# Patient Record
Sex: Female | Born: 1945 | Race: White | Hispanic: No | Marital: Married | Smoking: Never smoker
Health system: Southern US, Community
[De-identification: ages and names within clinical notes are randomized; demographics above are authoritative.]

## PROBLEM LIST (undated history)

## (undated) DIAGNOSIS — T7840XA Allergy, unspecified, initial encounter: Secondary | ICD-10-CM

## (undated) DIAGNOSIS — N390 Urinary tract infection, site not specified: Secondary | ICD-10-CM

## (undated) DIAGNOSIS — K219 Gastro-esophageal reflux disease without esophagitis: Secondary | ICD-10-CM

## (undated) DIAGNOSIS — F32A Depression, unspecified: Secondary | ICD-10-CM

## (undated) DIAGNOSIS — F329 Major depressive disorder, single episode, unspecified: Secondary | ICD-10-CM

## (undated) DIAGNOSIS — K5190 Ulcerative colitis, unspecified, without complications: Secondary | ICD-10-CM

## (undated) DIAGNOSIS — J45909 Unspecified asthma, uncomplicated: Secondary | ICD-10-CM

## (undated) HISTORY — DX: Major depressive disorder, single episode, unspecified: F32.9

## (undated) HISTORY — DX: Urinary tract infection, site not specified: N39.0

## (undated) HISTORY — DX: Unspecified asthma, uncomplicated: J45.909

## (undated) HISTORY — DX: Ulcerative colitis, unspecified, without complications: K51.90

## (undated) HISTORY — DX: Allergy, unspecified, initial encounter: T78.40XA

## (undated) HISTORY — DX: Depression, unspecified: F32.A

## (undated) HISTORY — PX: TONSILLECTOMY: SHX5217

## (undated) HISTORY — DX: Gastro-esophageal reflux disease without esophagitis: K21.9

## (undated) HISTORY — PX: COLONOSCOPY: SHX174

---
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
## 2013-12-30 LAB — HM PAP SMEAR

## 2014-04-01 DIAGNOSIS — J45909 Unspecified asthma, uncomplicated: Secondary | ICD-10-CM | POA: Diagnosis not present

## 2014-04-01 DIAGNOSIS — K5190 Ulcerative colitis, unspecified, without complications: Secondary | ICD-10-CM | POA: Diagnosis not present

## 2014-04-01 DIAGNOSIS — R197 Diarrhea, unspecified: Secondary | ICD-10-CM | POA: Diagnosis not present

## 2014-05-07 DIAGNOSIS — F3289 Other specified depressive episodes: Secondary | ICD-10-CM | POA: Diagnosis not present

## 2014-05-07 DIAGNOSIS — F329 Major depressive disorder, single episode, unspecified: Secondary | ICD-10-CM | POA: Diagnosis not present

## 2014-05-07 DIAGNOSIS — Z23 Encounter for immunization: Secondary | ICD-10-CM | POA: Diagnosis not present

## 2014-05-27 DIAGNOSIS — L819 Disorder of pigmentation, unspecified: Secondary | ICD-10-CM | POA: Diagnosis not present

## 2014-05-27 DIAGNOSIS — R238 Other skin changes: Secondary | ICD-10-CM | POA: Diagnosis not present

## 2014-05-27 DIAGNOSIS — L708 Other acne: Secondary | ICD-10-CM | POA: Diagnosis not present

## 2014-05-27 DIAGNOSIS — D235 Other benign neoplasm of skin of trunk: Secondary | ICD-10-CM | POA: Diagnosis not present

## 2014-05-29 LAB — HM COLONOSCOPY

## 2014-10-08 DIAGNOSIS — Z23 Encounter for immunization: Secondary | ICD-10-CM | POA: Diagnosis not present

## 2014-12-16 DIAGNOSIS — L821 Other seborrheic keratosis: Secondary | ICD-10-CM | POA: Diagnosis not present

## 2014-12-16 DIAGNOSIS — L720 Epidermal cyst: Secondary | ICD-10-CM | POA: Diagnosis not present

## 2014-12-16 DIAGNOSIS — D229 Melanocytic nevi, unspecified: Secondary | ICD-10-CM | POA: Diagnosis not present

## 2014-12-16 DIAGNOSIS — B351 Tinea unguium: Secondary | ICD-10-CM | POA: Diagnosis not present

## 2014-12-16 DIAGNOSIS — L700 Acne vulgaris: Secondary | ICD-10-CM | POA: Diagnosis not present

## 2014-12-30 DIAGNOSIS — Z01419 Encounter for gynecological examination (general) (routine) without abnormal findings: Secondary | ICD-10-CM | POA: Diagnosis not present

## 2014-12-30 DIAGNOSIS — Z1231 Encounter for screening mammogram for malignant neoplasm of breast: Secondary | ICD-10-CM | POA: Diagnosis not present

## 2014-12-30 LAB — HM MAMMOGRAPHY: HM Mammogram: NORMAL

## 2014-12-31 ENCOUNTER — Ambulatory Visit (INDEPENDENT_AMBULATORY_CARE_PROVIDER_SITE_OTHER): Payer: Medicare Other | Admitting: Internal Medicine

## 2014-12-31 ENCOUNTER — Encounter: Payer: Self-pay | Admitting: Internal Medicine

## 2014-12-31 VITALS — BP 140/90 | HR 91 | Temp 98.0°F | Resp 20 | Ht 65.0 in | Wt 172.0 lb

## 2014-12-31 DIAGNOSIS — K219 Gastro-esophageal reflux disease without esophagitis: Secondary | ICD-10-CM | POA: Diagnosis not present

## 2014-12-31 DIAGNOSIS — Z0000 Encounter for general adult medical examination without abnormal findings: Secondary | ICD-10-CM | POA: Diagnosis not present

## 2014-12-31 DIAGNOSIS — K5190 Ulcerative colitis, unspecified, without complications: Secondary | ICD-10-CM

## 2014-12-31 DIAGNOSIS — M810 Age-related osteoporosis without current pathological fracture: Secondary | ICD-10-CM | POA: Diagnosis not present

## 2014-12-31 MED ORDER — VENLAFAXINE HCL ER 37.5 MG PO CP24: 37.5000 mg | ORAL_CAPSULE | ORAL | Status: DC

## 2014-12-31 NOTE — Patient Instructions (Signed)
It is important that you exercise regularly, at least 20 minutes 3 to 4 times per week.  If you develop chest pain or shortness of breath seek  medical attention.  Please check your blood pressure on a regular basis.  If it is consistently greater than 150/90, please make an office appointment.  Fat and Cholesterol Control Diet Your diet has an affect on your fat and cholesterol levels in your blood and organs. Too much fat and cholesterol in your blood can affect your:  Heart.  Blood vessels (arteries, veins).  Gallbladder.  Liver.  Pancreas. CONTROL FAT AND CHOLESTEROL WITH DIET Certain foods raise cholesterol and others lower it. It is important to replace bad fats with other types of fat.  Do not eat:  Fatty meats, such as hot dogs and salami.  Stick margarine and some tub margarines that have "partially hydrogenated oils" in them.  Baked goods, such as cookies and crackers that have "partially hydrogenated oils" in them.  Saturated tropical oils, such as coconut and palm oil. Eat the following foods:  Round or loin cuts of red meat.  Chicken (without skin).  Fish.  Veal.  [place] breast.  Shellfish.  Fruit, such as apples.  Vegetables, such as broccoli, potatoes, and carrots.  Beans, peas, and lentils (legumes).  Grains, such as barley, rice, couscous, and bulgar wheat.  Pasta (without cream sauces). Look for foods that are nonfat, low in fat, and low in cholesterol.  FIND FOODS THAT ARE LOWER IN FAT AND CHOLESTEROL  Find foods with soluble fiber and plant sterols (phytosterol). You should eat 2 grams a day of these foods. These foods include:  Fruits.  Vegetables.  Whole grains.  Dried beans and peas.  Nuts and seeds.  Read package labels. Look for low-saturated fats, trans fat free, low-fat foods.  Choose cheese that have only 2 to 3 grams of saturated fat per ounce.  Use heart-healthy tub margarine that is free of trans fat or  partially hydrogenated oil.  Avoid buying baked goods that have partially hydrogenated oils in them. Instead, buy baked goods made with whole grains (whole-wheat or whole oat flour). Avoid baked goods labeled with "flour" or "enriched flour."  Buy non-creamy canned soups with reduced salt and no added fats. PREPARING YOUR FOOD  Broil, bake, steam, or roast foods. Do not fry food.  Use non-stick cooking sprays.  Use lemon or herbs to flavor food instead of using butter or stick margarine.  Use nonfat yogurt, salsa, or low-fat dressings for salads. LOW-SATURATED FAT / LOW-FAT FOOD SUBSTITUTES  Meats / Saturated Fat (g)  Avoid: Steak, marbled (3 oz/85 g) / 11 g.  Choose: Steak, lean (3 oz/85 g) / 4 g.  Avoid: Hamburger (3 oz/85 g) / 7 g.  Choose: Hamburger, lean (3 oz/85 g) / 5 g.  Avoid: Ham (3 oz/85 g) / 6 g.  Choose: Ham, lean cut (3 oz/85 g) / 2.4 g.  Avoid: Chicken, with skin, dark meat (3 oz/85 g) / 4 g.  Choose: Chicken, skin removed, dark meat (3 oz/85 g) / 2 g.  Avoid: Chicken, with skin, light meat (3 oz/85 g) / 2.5 g.  Choose: Chicken, skin removed, light meat (3 oz/85 g) / 1 g. Dairy / Saturated Fat (g)  Avoid: Whole milk (1 cup) / 5 g.  Choose: Low-fat milk, 2% (1 cup) / 3 g.  Choose: Low-fat milk, 1% (1 cup) / 1.5 g.  Choose: Skim milk (1 cup) / 0.3 g.  Avoid: Hard cheese (1 oz/28 g) / 6 g.  Choose: Skim milk cheese (1 oz/28 g) / 2 to 3 g.  Avoid: Cottage cheese, 4% fat (1 cup) / 6.5 g.  Choose: Low-fat cottage cheese, 1% fat (1 cup) / 1.5 g.  Avoid: Ice cream (1 cup) / 9 g.  Choose: Sherbet (1 cup) / 2.5 g.  Choose: Nonfat frozen yogurt (1 cup) / 0.3 g.  Choose: Frozen fruit bar / trace.  Avoid: Whipped cream (1 tbs) / 3.5 g.  Choose: Nondairy whipped topping (1 tbs) / 1 g. Condiments / Saturated Fat (g)  Avoid: Mayonnaise (1 tbs) / 2 g.  Choose: Low-fat mayonnaise (1 tbs) / 1 g.  Avoid: Butter (1 tbs) / 7 g.  Choose: Extra light  margarine (1 tbs) / 1 g.  Avoid: Coconut oil (1 tbs) / 11.8 g.  Choose: Olive oil (1 tbs) / 1.8 g.  Choose: Corn oil (1 tbs) / 1.7 g.  Choose: Safflower oil (1 tbs) / 1.2 g.  Choose: Sunflower oil (1 tbs) / 1.4 g.  Choose: Soybean oil (1 tbs) / 2.4 g .  Choose: Canola oil (1 tbs) / 1 g. Document Released: 06/04/2012 Document Revised: 08/06/2013 Document Reviewed: 03/05/2014 [facility] Patient Information 2015 [place], [place]. This information is not intended to replace advice given to you by your health care provider. Make sure you discuss any questions you have with your health care provider. Health Maintenance Adopting a healthy lifestyle and getting preventive care can go a long way to promote health and wellness. Talk with your health care provider about what schedule of regular examinations is right for you. This is a good chance for you to check in with your provider about disease prevention and staying healthy. In between checkups, there are plenty of things you can do on your own. Experts have done a lot of research about which lifestyle changes and preventive measures are most likely to keep you healthy. Ask your health care provider for more information. WEIGHT AND DIET  Eat a healthy diet  Be sure to include plenty of vegetables, fruits, low-fat dairy products, and lean protein.  Do not eat a lot of foods high in solid fats, added sugars, or salt.  Get regular exercise. This is one of the most important things you can do for your health.  Most adults should exercise for at least 150 minutes each week. The exercise should increase your heart rate and make you sweat (moderate-intensity exercise).  Most adults should also do strengthening exercises at least twice a week. This is in addition to the moderate-intensity exercise.  Maintain a healthy weight  Body mass index (BMI) is a measurement that can be used to identify possible weight problems. It estimates body fat based on  height and weight. Your health care provider can help determine your BMI and help you achieve or maintain a healthy weight.  For females 23 years of age and older:   A BMI below 18.5 is considered underweight.  A BMI of 18.5 to 24.9 is normal.  A BMI of 25 to 29.9 is considered overweight.  A BMI of 30 and above is considered obese.  Watch levels of cholesterol and blood lipids  You should start having your blood tested for lipids and cholesterol at 67 years of age, then have this test every 5 years.  You may need to have your cholesterol levels checked more often if:  Your lipid or cholesterol levels are high.  You  are older than 67 years of age.  You are at high risk for heart disease.  CANCER SCREENING   Lung Cancer  Lung cancer screening is recommended for adults 30-70 years old who are at high risk for lung cancer because of a history of smoking.  A yearly low-dose CT scan of the lungs is recommended for people who:  Currently smoke.  Have quit within the past 15 years.  Have at least a 30-pack-year history of smoking. A pack year is smoking an average of one pack of cigarettes a day for 1 year.  Yearly screening should continue until it has been 15 years since you quit.  Yearly screening should stop if you develop a health problem that would prevent you from having lung cancer treatment.  Breast Cancer  Practice breast self-awareness. This means understanding how your breasts normally appear and feel.  It also means doing regular breast self-exams. Let your health care provider know about any changes, no matter how small.  If you are in your 20s or 30s, you should have a clinical breast exam (CBE) by a health care provider every 1-3 years as part of a regular health exam.  If you are 27 or older, have a CBE every year. Also consider having a breast X-ray (mammogram) every year.  If you have a family history of breast cancer, talk to your health care  provider about genetic screening.  If you are at high risk for breast cancer, talk to your health care provider about having an MRI and a mammogram every year.  Breast cancer gene (BRCA) assessment is recommended for women who have family members with BRCA-related cancers. BRCA-related cancers include:  Breast.  Ovarian.  Tubal.  Peritoneal cancers.  Results of the assessment will determine the need for genetic counseling and BRCA1 and BRCA2 testing. Cervical Cancer Routine pelvic examinations to screen for cervical cancer are no longer recommended for nonpregnant women who are considered low risk for cancer of the pelvic organs (ovaries, uterus, and vagina) and who do not have symptoms. A pelvic examination may be necessary if you have symptoms including those associated with pelvic infections. Ask your health care provider if a screening pelvic exam is right for you.   The Pap test is the screening test for cervical cancer for women who are considered at risk.  If you had a hysterectomy for a problem that was not cancer or a condition that could lead to cancer, then you no longer need Pap tests.  If you are older than 65 years, and you have had normal Pap tests for the past 10 years, you no longer need to have Pap tests.  If you have had past treatment for cervical cancer or a condition that could lead to cancer, you need Pap tests and screening for cancer for at least 20 years after your treatment.  If you no longer get a Pap test, assess your risk factors if they change (such as having a new sexual partner). This can affect whether you should start being screened again.  Some women have medical problems that increase their chance of getting cervical cancer. If this is the case for you, your health care provider may recommend more frequent screening and Pap tests.  The human papillomavirus (HPV) test is another test that may be used for cervical cancer screening. The HPV test looks  for the virus that can cause cell changes in the cervix. The cells collected during the Pap test can be  tested for HPV.  The HPV test can be used to screen women 37 years of age and older. Getting tested for HPV can extend the interval between normal Pap tests from three to five years.  An HPV test also should be used to screen women of any age who have unclear Pap test results.  After 67 years of age, women should have HPV testing as often as Pap tests.  Colorectal Cancer  This type of cancer can be detected and often prevented.  Routine colorectal cancer screening usually begins at 67 years of age and continues through 67 years of age.  Your health care provider may recommend screening at an earlier age if you have risk factors for colon cancer.  Your health care provider may also recommend using home test kits to check for hidden blood in the stool.  A small camera at the end of a tube can be used to examine your colon directly (sigmoidoscopy or colonoscopy). This is done to check for the earliest forms of colorectal cancer.  Routine screening usually begins at age 9.  Direct examination of the colon should be repeated every 5-10 years through 67 years of age. However, you may need to be screened more often if early forms of precancerous polyps or small growths are found. Skin Cancer  Check your skin from head to toe regularly.  Tell your health care provider about any new moles or changes in moles, especially if there is a change in a mole's shape or color.  Also tell your health care provider if you have a mole that is larger than the size of a pencil eraser.  Always use sunscreen. Apply sunscreen liberally and repeatedly throughout the day.  Protect yourself by wearing long sleeves, pants, a wide-brimmed hat, and sunglasses whenever you are outside. HEART DISEASE, DIABETES, AND HIGH BLOOD PRESSURE   Have your blood pressure checked at least every 1-2 years. High blood  pressure causes heart disease and increases the risk of stroke.  If you are between 2 years and 49 years old, ask your health care provider if you should take aspirin to prevent strokes.  Have regular diabetes screenings. This involves taking a blood sample to check your fasting blood sugar level.  If you are at a normal weight and have a low risk for diabetes, have this test once every three years after 67 years of age.  If you are overweight and have a high risk for diabetes, consider being tested at a younger age or more often. PREVENTING INFECTION  Hepatitis B  If you have a higher risk for hepatitis B, you should be screened for this virus. You are considered at high risk for hepatitis B if:  You were born in a country where hepatitis B is common. Ask your health care provider which countries are considered high risk.  Your parents were born in a high-risk country, and you have not been immunized against hepatitis B (hepatitis B vaccine).  You have HIV or AIDS.  You use needles to inject street drugs.  You live with someone who has hepatitis B.  You have had sex with someone who has hepatitis B.  You get hemodialysis treatment.  You take certain medicines for conditions, including cancer, organ transplantation, and autoimmune conditions. Hepatitis C  Blood testing is recommended for:  Everyone born from [birth year] through 1963.  Anyone with known risk factors for hepatitis C. Sexually transmitted infections (STIs)  You should be screened for sexually transmitted infections (  STIs) including gonorrhea and chlamydia if:  You are sexually active and are younger than 67 years of age.  You are older than 67 years of age and your health care provider tells you that you are at risk for this type of infection.  Your sexual activity has changed since you were last screened and you are at an increased risk for chlamydia or gonorrhea. Ask your health care provider if you are at  risk.  If you do not have HIV, but are at risk, it may be recommended that you take a prescription medicine daily to prevent HIV infection. This is called pre-exposure prophylaxis (PrEP). You are considered at risk if:  You are sexually active and do not regularly use condoms or know the HIV status of your partner(s).  You take drugs by injection.  You are sexually active with a partner who has HIV. Talk with your health care provider about whether you are at high risk of being infected with HIV. If you choose to begin PrEP, you should first be tested for HIV. You should then be tested every 3 months for as long as you are taking PrEP.  PREGNANCY   If you are premenopausal and you may become pregnant, ask your health care provider about preconception counseling.  If you may become pregnant, take 400 to 800 micrograms (mcg) of folic acid every day.  If you want to prevent pregnancy, talk to your health care provider about birth control (contraception). OSTEOPOROSIS AND MENOPAUSE   Osteoporosis is a disease in which the bones lose minerals and strength with aging. This can result in serious bone fractures. Your risk for osteoporosis can be identified using a bone density scan.  If you are 9 years of age or older, or if you are at risk for osteoporosis and fractures, ask your health care provider if you should be screened.  Ask your health care provider whether you should take a calcium or vitamin D supplement to lower your risk for osteoporosis.  Menopause may have certain physical symptoms and risks.  Hormone replacement therapy may reduce some of these symptoms and risks. Talk to your health care provider about whether hormone replacement therapy is right for you.  HOME CARE INSTRUCTIONS   Schedule regular health, dental, and eye exams.  Stay current with your immunizations.   Do not use any tobacco products including cigarettes, chewing tobacco, or electronic cigarettes.  If  you are pregnant, do not drink alcohol.  If you are breastfeeding, limit how much and how often you drink alcohol.  Limit alcohol intake to no more than 1 drink per day for nonpregnant women. One drink equals 12 ounces of beer, 5 ounces of wine, or 1 ounces of hard liquor.  Do not use street drugs.  Do not share needles.  Ask your health care provider for help if you need support or information about quitting drugs.  Tell your health care provider if you often feel depressed.  Tell your health care provider if you have ever been abused or do not feel safe at home. Document Released: 06/19/2011 Document Revised: 04/20/2014 Document Reviewed: 11/05/2013 [facility] Patient Information 2015 [place], [place]. This information is not intended to replace advice given to you by your health care provider. Make sure you discuss any questions you have with your health care provider.

## 2014-12-31 NOTE — Progress Notes (Signed)
Subjective:    Patient ID: Whitney Beasley, female    DOB: [birth year], 69 y.o.   MRN: [id number]  HPI  69 year old patient who is seen today to establish with our practice. She has a history of situational anxiety and has been on a every other day regimen of Effexor.  Stressors included illness with her mother who suffered an MI and also a suicide attempt from her son who has bipolar disorder.  At the present time.  She is doing quite well She had a recent gynecologic examination yesterday Past medical history is pertinent for a history of ulcerative colitis.  She was taken off medications in April 2015 at which time her last colonoscopy was normal.  No GI symptoms She has a history of childhood asthma and allergic rhinitis Additionally history of GERD which has been stable off medication History of osteoporosis, presently is on raloxifene by OB/GYN  Gravida 1, para one, abortus 0, 1 vaginal delivery 7  Family history mother age 65.  Recent MI.  Has a history of diabetes and osteoporosis Father died in his eighties, history of COPD and colonic polyps One sister has a history of chronic back pain  Social history.  Married.  One son with bipolar disorder Relocated back to [place] area after 15 years in [place], [place] Nonsmoker   1. Risk factors, based on past  M,S,F history.  No cardiovascular risk factors  2.  Physical activities: No activity restrictions.  Walks daily  3.  Depression/mood: History of situational anxiety, presently stable  4.  Hearing: No deficits  5.  ADL's: Independent in all aspects of daily living  6.  Fall risk: Low  7.  Home safety: No problems identified  8.  Height weight, and visual acuity; height and weight stable no change in visual acuity  9.  Counseling: Heart healthy diet regular exercise.  Modest weight loss.  All encouraged  10. Lab orders based on risk factors: Will review office notes from her previous primary care  physician  11. Referral : We'll need follow-up colonoscopy 2018  12. Care plan: Heart healthy diet  13. Cognitive assessment: Alert and oriented with normal affect no cognitive dysfunction  14. Screening: We'll continue annual health examination with primary care and GYN  15. Provider List Update: GYN GI and primary care    Review of Systems  Constitutional: Negative for fever, appetite change, fatigue and unexpected weight change.  HENT: Negative for congestion, dental problem, ear pain, hearing loss, mouth sores, nosebleeds, sinus pressure, sore throat, tinnitus, trouble swallowing and voice change.   Eyes: Negative for photophobia, pain, redness and visual disturbance.  Respiratory: Negative for cough, chest tightness and shortness of breath.   Cardiovascular: Negative for chest pain, palpitations and leg swelling.  Gastrointestinal: Negative for nausea, vomiting, abdominal pain, diarrhea, constipation, blood in stool, abdominal distention and rectal pain.  Genitourinary: Negative for dysuria, urgency, frequency, hematuria, flank pain, vaginal bleeding, vaginal discharge, difficulty urinating, genital sores, vaginal pain, menstrual problem and pelvic pain.  Musculoskeletal: Negative for back pain, arthralgias and neck stiffness.  Skin: Negative for rash.  Neurological: Negative for dizziness, syncope, speech difficulty, weakness, light-headedness, numbness and headaches.  Hematological: Negative for adenopathy. Does not bruise/bleed easily.  Psychiatric/Behavioral: Negative for suicidal ideas, behavioral problems, self-injury, dysphoric mood and agitation. The patient is not nervous/anxious.        Objective:   Physical Exam  Constitutional: She is oriented to person, place, and time. She appears well-developed and well-nourished.  HENT:  Head: Normocephalic and atraumatic.  Right Ear: External ear normal.  Left Ear: External ear normal.  Mouth/Throat: Oropharynx is clear and  moist.  Eyes: Conjunctivae and EOM are normal.  Neck: Normal range of motion. Neck supple. No JVD present. No thyromegaly present.  Cardiovascular: Normal rate, regular rhythm, normal heart sounds and intact distal pulses.   No murmur heard. Pulmonary/Chest: Effort normal and breath sounds normal. She has no wheezes. She has no rales.  Abdominal: Soft. Bowel sounds are normal. She exhibits no distension and no mass. There is no tenderness. There is no rebound and no guarding.  Musculoskeletal: Normal range of motion. She exhibits no edema or tenderness.  Neurological: She is alert and oriented to person, place, and time. She has normal reflexes. No cranial nerve deficit. She exhibits normal muscle tone. Coordination normal.  Skin: Skin is warm and dry. No rash noted.  Psychiatric: She has a normal mood and affect. Her behavior is normal.          Assessment & Plan:    Preventive health Osteoporosis.  Continue the Evista, calcium and vitamin D supplementation Situational stress.  Patient will consider further taper and discontinuation of Effexor GERD.  We'll continue antireflux regimen and when necessary Prilosec History of ulcerative colitis.  Follow-up colonoscopy 2018 recommended  Medical records will be obtained Heart healthy diet regular exercise and modest weight loss.  All encouraged

## 2014-12-31 NOTE — Progress Notes (Signed)
Pre visit review using our clinic review tool, if applicable. No additional management support is needed unless otherwise documented below in the visit note. 

## 2015-01-22 ENCOUNTER — Other Ambulatory Visit: Payer: Self-pay | Admitting: *Deleted

## 2015-03-07 DIAGNOSIS — J189 Pneumonia, unspecified organism: Secondary | ICD-10-CM | POA: Diagnosis not present

## 2015-03-07 DIAGNOSIS — R05 Cough: Secondary | ICD-10-CM | POA: Diagnosis not present

## 2015-03-07 DIAGNOSIS — J329 Chronic sinusitis, unspecified: Secondary | ICD-10-CM | POA: Diagnosis not present

## 2015-03-17 DIAGNOSIS — D229 Melanocytic nevi, unspecified: Secondary | ICD-10-CM | POA: Diagnosis not present

## 2015-03-17 DIAGNOSIS — B351 Tinea unguium: Secondary | ICD-10-CM | POA: Diagnosis not present

## 2015-03-17 DIAGNOSIS — D1801 Hemangioma of skin and subcutaneous tissue: Secondary | ICD-10-CM | POA: Diagnosis not present

## 2015-03-17 DIAGNOSIS — D225 Melanocytic nevi of trunk: Secondary | ICD-10-CM | POA: Diagnosis not present

## 2015-03-17 DIAGNOSIS — Z85828 Personal history of other malignant neoplasm of skin: Secondary | ICD-10-CM | POA: Diagnosis not present

## 2015-03-17 DIAGNOSIS — L821 Other seborrheic keratosis: Secondary | ICD-10-CM | POA: Diagnosis not present

## 2015-03-30 ENCOUNTER — Telehealth: Payer: Self-pay | Admitting: Internal Medicine

## 2015-03-30 ENCOUNTER — Telehealth: Payer: Self-pay | Admitting: *Deleted

## 2015-03-30 ENCOUNTER — Encounter: Payer: Self-pay | Admitting: *Deleted

## 2015-03-30 NOTE — Telephone Encounter (Signed)
Received medical records from [facility]. Sent to Dr. Burnice Logan. 03/30/15/ss

## 2015-03-30 NOTE — Telephone Encounter (Signed)
Updated immunizations and medication lists.

## 2015-06-09 ENCOUNTER — Ambulatory Visit (INDEPENDENT_AMBULATORY_CARE_PROVIDER_SITE_OTHER): Payer: Medicare Other | Admitting: Internal Medicine

## 2015-06-09 ENCOUNTER — Encounter: Payer: Self-pay | Admitting: Internal Medicine

## 2015-06-09 VITALS — BP 110/74 | HR 92 | Temp 98.3°F | Resp 20 | Ht 65.0 in | Wt 168.0 lb

## 2015-06-09 DIAGNOSIS — W57XXXA Bitten or stung by nonvenomous insect and other nonvenomous arthropods, initial encounter: Secondary | ICD-10-CM

## 2015-06-09 DIAGNOSIS — T148 Other injury of unspecified body region: Secondary | ICD-10-CM

## 2015-06-09 DIAGNOSIS — M791 Myalgia, unspecified site: Secondary | ICD-10-CM

## 2015-06-09 NOTE — Patient Instructions (Signed)
Report any new or worsening symptoms  Call or return to clinic prn if these symptoms worsen or fail to improve as anticipated.

## 2015-06-09 NOTE — Progress Notes (Signed)
Subjective:    Patient ID: Whitney Beasley, female    DOB: January 17, 1946, 69 y.o.   MRN: [id number]  HPI  69 year old patient who is seen today concerned about tick bite exposure.  2 weeks ago she removed tics  from both her left and right chest wall areas.  No tics were engorged. For the past 3 days she has felt unwell with some fever, chills and myalgias.  No documented fever.  She still has some malaise but has felt slightly better over the past 24 hours.  Past Medical History  Diagnosis Date  . Asthma   . Depression   . Allergy   . GERD (gastroesophageal reflux disease)   . UTI (urinary tract infection)   . Ulcerative colitis     History   Social History  . Marital Status: Married    Spouse Name: N/A  . Number of Children: N/A  . Years of Education: N/A   Occupational History  . Not on file.   Social History Main Topics  . Smoking status: Never Smoker   . Smokeless tobacco: Never Used  . Alcohol Use: 8.4 oz/week    14 Glasses of wine per week  . Drug Use: No  . Sexual Activity:    Partners: Male   Other Topics Concern  . Not on file   Social History Narrative    Past Surgical History  Procedure Laterality Date  . Tonsillectomy Bilateral 1950's    No family history on file.  Allergies  Allergen Reactions  . Minocycline Other (See Comments)    Joint pain and Fatigue    Current Outpatient Prescriptions on File Prior to Visit  Medication Sig Dispense Refill  . B Complex-C (SUPER B COMPLEX PO) Take 1 tablet by mouth daily.    . calcium carbonate 200 MG capsule Take 1,000 mg by mouth daily.    . cephALEXin (KEFLEX) 500 MG capsule Take 500 mg by mouth as needed (acne). Prescribed by Dermatologist    . Glucosamine-Chondroitin 750-600 MG TABS Take 1 tablet by mouth daily.    Marland Kitchen loratadine (CLARITIN) 10 MG tablet Take 10 mg by mouth daily.    . Multiple Vitamin (MULTIVITAMIN) tablet Take 1 tablet by mouth daily.    . Omega-3 Fatty Acids (OMEGA 3 PO) Take 1  capsule by mouth daily.    Marland Kitchen omeprazole (PRILOSEC) 20 MG capsule Take 20 mg by mouth daily.    . raloxifene (EVISTA) 60 MG tablet Take 60 mg by mouth daily. Prescribed by Dr. Marina Gravel, GYN     No current facility-administered medications on file prior to visit.    BP 110/74 mmHg  Pulse 92  Temp(Src) 98.3 F (36.8 C) (Oral)  Resp 20  Ht 5' 5"  (1.651 m)  Wt 168 lb (76.204 kg)  BMI 27.96 kg/m2  SpO2 98%  LMP  (LMP Unknown)     Review of Systems  Constitutional: Positive for chills, activity change, appetite change and fatigue. Negative for fever and diaphoresis.  HENT: Negative for congestion, dental problem, hearing loss, rhinorrhea, sinus pressure, sore throat and tinnitus.   Eyes: Negative for pain, discharge and visual disturbance.  Respiratory: Negative for cough and shortness of breath.   Cardiovascular: Negative for chest pain, palpitations and leg swelling.  Gastrointestinal: Negative for nausea, vomiting, abdominal pain, diarrhea, constipation, blood in stool and abdominal distention.  Genitourinary: Negative for dysuria, urgency, frequency, hematuria, flank pain, vaginal bleeding, vaginal discharge, difficulty urinating, vaginal pain and pelvic pain.  Musculoskeletal: Negative  for joint swelling, arthralgias and gait problem.  Skin: Negative for rash.  Neurological: Negative for dizziness, syncope, speech difficulty, weakness, numbness and headaches.  Hematological: Negative for adenopathy.  Psychiatric/Behavioral: Negative for behavioral problems, dysphoric mood and agitation. The patient is not nervous/anxious.        Objective:   Physical Exam  Constitutional: She is oriented to person, place, and time. She appears well-developed and well-nourished.  HENT:  Head: Normocephalic.  Right Ear: External ear normal.  Left Ear: External ear normal.  Mouth/Throat: Oropharynx is clear and moist.  Eyes: Conjunctivae and EOM are normal. Pupils are equal, round, and  reactive to light.  Neck: Normal range of motion. Neck supple. No thyromegaly present.  Cardiovascular: Normal rate, regular rhythm, normal heart sounds and intact distal pulses.   Pulmonary/Chest: Effort normal and breath sounds normal.  Abdominal: Soft. Bowel sounds are normal. She exhibits no mass. There is no tenderness.  Musculoskeletal: Normal range of motion.  Lymphadenopathy:    She has no cervical adenopathy.  Neurological: She is alert and oriented to person, place, and time.  Skin: Skin is warm and dry. No rash noted.  2.  Small scaly papules 3-4 mm in diameter involving the left and right chest wall areas.  No surrounding erythema  Psychiatric: She has a normal mood and affect. Her behavior is normal.          Assessment & Plan:   Tic bite exposure/ viral symdrome.   Initial plan was to treat prophylactically with doxycycline for 1 week.  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  No other good choices for antibiotic prophylaxis.  Since tick born illness.  Likelihood is low.  We'll clinically observe at this time.  She will report any new or worsening symptoms

## 2015-06-09 NOTE — Progress Notes (Signed)
Pre visit review using our clinic review tool, if applicable. No additional management support is needed unless otherwise documented below in the visit note. 

## 2015-10-04 ENCOUNTER — Ambulatory Visit: Payer: Medicare Other | Admitting: *Deleted

## 2015-10-28 DIAGNOSIS — Z23 Encounter for immunization: Secondary | ICD-10-CM | POA: Diagnosis not present

## 2015-12-02 DIAGNOSIS — L812 Freckles: Secondary | ICD-10-CM | POA: Diagnosis not present

## 2015-12-02 DIAGNOSIS — L700 Acne vulgaris: Secondary | ICD-10-CM | POA: Diagnosis not present

## 2015-12-02 DIAGNOSIS — D225 Melanocytic nevi of trunk: Secondary | ICD-10-CM | POA: Diagnosis not present

## 2015-12-02 DIAGNOSIS — Z85828 Personal history of other malignant neoplasm of skin: Secondary | ICD-10-CM | POA: Diagnosis not present

## 2015-12-02 DIAGNOSIS — L821 Other seborrheic keratosis: Secondary | ICD-10-CM | POA: Diagnosis not present

## 2016-01-13 DIAGNOSIS — Z124 Encounter for screening for malignant neoplasm of cervix: Secondary | ICD-10-CM | POA: Diagnosis not present

## 2016-01-13 DIAGNOSIS — Z6828 Body mass index (BMI) 28.0-28.9, adult: Secondary | ICD-10-CM | POA: Diagnosis not present

## 2016-01-13 DIAGNOSIS — Z1231 Encounter for screening mammogram for malignant neoplasm of breast: Secondary | ICD-10-CM | POA: Diagnosis not present

## 2016-01-18 ENCOUNTER — Other Ambulatory Visit: Payer: Self-pay | Admitting: Obstetrics and Gynecology

## 2016-01-18 DIAGNOSIS — R928 Other abnormal and inconclusive findings on diagnostic imaging of breast: Secondary | ICD-10-CM

## 2016-02-02 ENCOUNTER — Other Ambulatory Visit: Payer: Self-pay | Admitting: Obstetrics and Gynecology

## 2016-02-02 ENCOUNTER — Ambulatory Visit
Admission: RE | Admit: 2016-02-02 | Discharge: 2016-02-02 | Disposition: A | Discharge status: Home / Self Care | Payer: Medicare Other | Source: Ambulatory Visit | Attending: Obstetrics and Gynecology | Admitting: Obstetrics and Gynecology

## 2016-02-02 DIAGNOSIS — R928 Other abnormal and inconclusive findings on diagnostic imaging of breast: Secondary | ICD-10-CM

## 2016-02-02 DIAGNOSIS — N63 Unspecified lump in breast: Secondary | ICD-10-CM | POA: Diagnosis not present

## 2016-02-09 ENCOUNTER — Other Ambulatory Visit: Payer: Self-pay | Admitting: Obstetrics and Gynecology

## 2016-02-09 ENCOUNTER — Ambulatory Visit
Admission: RE | Admit: 2016-02-09 | Discharge: 2016-02-09 | Disposition: A | Discharge status: Home / Self Care | Payer: Medicare Other | Source: Ambulatory Visit | Attending: Obstetrics and Gynecology | Admitting: Obstetrics and Gynecology

## 2016-02-09 DIAGNOSIS — R928 Other abnormal and inconclusive findings on diagnostic imaging of breast: Secondary | ICD-10-CM

## 2016-02-09 DIAGNOSIS — N63 Unspecified lump in breast: Secondary | ICD-10-CM | POA: Diagnosis not present

## 2016-02-09 DIAGNOSIS — N6031 Fibrosclerosis of right breast: Secondary | ICD-10-CM | POA: Diagnosis not present

## 2016-02-15 DIAGNOSIS — N958 Other specified menopausal and perimenopausal disorders: Secondary | ICD-10-CM | POA: Diagnosis not present

## 2016-02-15 DIAGNOSIS — M8588 Other specified disorders of bone density and structure, other site: Secondary | ICD-10-CM | POA: Diagnosis not present

## 2016-03-13 ENCOUNTER — Ambulatory Visit (INDEPENDENT_AMBULATORY_CARE_PROVIDER_SITE_OTHER): Payer: Medicare Other | Admitting: Family Medicine

## 2016-03-13 ENCOUNTER — Encounter: Payer: Self-pay | Admitting: Family Medicine

## 2016-03-13 VITALS — BP 108/70 | HR 90 | Temp 98.9°F | Ht 65.0 in | Wt 162.1 lb

## 2016-03-13 DIAGNOSIS — J069 Acute upper respiratory infection, unspecified: Secondary | ICD-10-CM | POA: Diagnosis not present

## 2016-03-13 NOTE — Progress Notes (Signed)
HPI:  URI: -started: 3 days ago -symptoms:nasal congestion, sore throat, cough, chest congestion and mild shortness of breath, fever to 101 yesterday, mild body aches -feeling somewhat better today with resolution of fevers -denies: NVD, tooth pain, sinus pain -has tried: Nothing -sick contacts/travel/risks: no reported flu, strep or tick exposure -Hx of: allergies, pneumonia last year  ROS: See pertinent positives and negatives per HPI.  Past Medical History  Diagnosis Date  . Asthma   . Depression   . Allergy   . GERD (gastroesophageal reflux disease)   . UTI (urinary tract infection)   . Ulcerative colitis [facility])     Past Surgical History  Procedure Laterality Date  . Tonsillectomy Bilateral 1950's    No family history on file.  Social History   Social History  . Marital Status: Married    Spouse Name: N/A  . Number of Children: N/A  . Years of Education: N/A   Social History Main Topics  . Smoking status: Never Smoker   . Smokeless tobacco: Never Used  . Alcohol Use: 8.4 oz/week    14 Glasses of wine per week  . Drug Use: No  . Sexual Activity:    Partners: Male   Other Topics Concern  . None   Social History Narrative     Current outpatient prescriptions:  .  B Complex-C (SUPER B COMPLEX PO), Take 1 tablet by mouth daily., Disp: , Rfl:  .  calcium carbonate 200 MG capsule, Take 1,600 mg by mouth daily. , Disp: , Rfl:  .  cephALEXin (KEFLEX) 500 MG capsule, Take 500 mg by mouth as needed (acne). Prescribed by Dermatologist, Disp: , Rfl:  .  Glucosamine-Chondroitin 750-600 MG TABS, Take 1 tablet by mouth daily., Disp: , Rfl:  .  loratadine (CLARITIN) 10 MG tablet, Take 10 mg by mouth daily., Disp: , Rfl:  .  Multiple Vitamin (MULTIVITAMIN) tablet, Take 1 tablet by mouth daily., Disp: , Rfl:  .  Omega-3 Fatty Acids (OMEGA 3 PO), Take 1 capsule by mouth daily., Disp: , Rfl:  .  omeprazole (PRILOSEC) 20 MG capsule, Take 20 mg by mouth daily., Disp: , Rfl:   .  raloxifene (EVISTA) 60 MG tablet, Take 60 mg by mouth daily. Prescribed by Dr. Marina Gravel, GYN, Disp: , Rfl:  .  venlafaxine (EFFEXOR) 37.5 MG tablet, Take 37.5 mg by mouth daily., Disp: , Rfl:   EXAM:  Filed Vitals:   03/13/16 1124  BP: 108/70  Pulse: 90  Temp: 98.9 F (37.2 C)    Body mass index is 26.97 kg/(m^2).  GENERAL: vitals reviewed and listed above, alert, oriented, appears well hydrated and in no acute distress  HEENT: atraumatic, conjunttiva clear, no obvious abnormalities on inspection of external nose and ears, normal appearance of ear canals and TMs, clear nasal congestion, mild post oropharyngeal erythema with PND, no tonsillar edema or exudate, no sinus TTP  NECK: no obvious masses on inspection  LUNGS: clear to auscultation bilaterally, no wheezes, rales or rhonchi, good air movement  CV: HRRR, no peripheral edema  MS: moves all extremities without noticeable abnormality  PSYCH: pleasant and cooperative, no obvious depression or anxiety  ASSESSMENT AND PLAN:  Discussed the following assessment and plan:  Acute upper respiratory infection  -given HPI and exam findings today, a serious infection or illness is unlikely. We discussed potential etiologies, with VURI or influenza being most likely - seems to be improving and exam is benign today. She declined flu testing and tamiflu after  discussion of risk and benefits. We did advise a potential complications and advised to follow up immediately if she is worsening or new symptoms develop. We discussed treatment side effects, likely course, antibiotic misuse, transmission, and signs of developing a serious illness. -of course, we advised to return or notify a doctor immediately if symptoms worsen or persist or new concerns arise.    Patient Instructions  INSTRUCTIONS FOR UPPER RESPIRATORY INFECTION:  -plenty of rest and fluids  -nasal saline wash 2-3 times daily (use prepackaged nasal saline or  bottled/distilled water if making your own)   -can use AFRIN nasal spray for drainage and nasal congestion - but do NOT use longer then 3-4 days  -can use tylenol (in no history of liver disease) or ibuprofen (if no history of kidney disease, bowel bleeding or significant heart disease) as directed for aches and sorethroat  -in the winter time, using a humidifier at night is helpful (please follow cleaning instructions)  -if you are taking a cough medication - use only as directed, may also try a teaspoon of honey to coat the throat and throat lozenges. If given a cough medication with codeine or hydrocodone or other narcotic please be advised that this contains a strong and  potentially addicting medication. Please follow instructions carefully, take as little as possible and only use AS NEEDED for severe cough. Discuss potential side effects with your pharmacy. Please do not drive or operate machinery while taking these types of medications. Please do not take other sedating medications, drugs or alcohol while taking this medication without discussing with your doctor.  -for sore throat, salt water gargles can help  -follow up if you have fevers, facial pain, tooth pain, difficulty breathing or are worsening or symptoms persist longer then expected  Upper Respiratory Infection, Adult An upper respiratory infection (URI) is also known as the common cold. It is often caused by a type of germ (virus). Colds are easily spread (contagious). You can pass it to others by kissing, coughing, sneezing, or drinking out of the same glass. Usually, you get better in 1 to 3  weeks.  However, the cough can last for even longer. HOME CARE   Only take medicine as told by your doctor. Follow instructions provided above.  Drink enough water and fluids to keep your pee (urine) clear or pale yellow.  Get plenty of rest.  Return to work when your temperature is < 100 for 24 hours or as told by your doctor. You  may use a face mask and wash your hands to stop your cold from spreading. GET HELP RIGHT AWAY IF:   After the first few days, you feel you are getting worse.  You have questions about your medicine.  You have chills, shortness of breath, or red spit (mucus).  You have pain in the face for more then 1-2 days, especially when you bend forward.  You have a fever, puffy (swollen) neck, pain when you swallow, or white spots in the back of your throat.  You have a bad headache, ear pain, sinus pain, or chest pain.  You have a high-pitched whistling sound when you breathe in and out (wheezing).  You cough up blood.  You have sore muscles or a stiff neck. MAKE SURE YOU:   Understand these instructions.  Will watch your condition.  Will get help right away if you are not doing well or get worse. Document Released: 05/22/2008 Document Revised: 02/26/2012 Document Reviewed: 03/11/2014 [employer] Patient Information 2015  [employer]. This information is not intended to replace advice given to you by your health care provider. Make sure you discuss any questions you have with your health care provider.      Colin Benton R.

## 2016-03-13 NOTE — Progress Notes (Signed)
Pre visit review using our clinic review tool, if applicable. No additional management support is needed unless otherwise documented below in the visit note. 

## 2016-03-13 NOTE — Patient Instructions (Signed)
INSTRUCTIONS FOR UPPER RESPIRATORY INFECTION:  -plenty of rest and fluids  -nasal saline wash 2-3 times daily (use prepackaged nasal saline or bottled/distilled water if making your own)   -can use AFRIN nasal spray for drainage and nasal congestion - but do NOT use longer then 3-4 days  -can use tylenol (in no history of liver disease) or ibuprofen (if no history of kidney disease, bowel bleeding or significant heart disease) as directed for aches and sorethroat  -in the winter time, using a humidifier at night is helpful (please follow cleaning instructions)  -if you are taking a cough medication - use only as directed, may also try a teaspoon of honey to coat the throat and throat lozenges. If given a cough medication with codeine or hydrocodone or other narcotic please be advised that this contains a strong and  potentially addicting medication. Please follow instructions carefully, take as little as possible and only use AS NEEDED for severe cough. Discuss potential side effects with your pharmacy. Please do not drive or operate machinery while taking these types of medications. Please do not take other sedating medications, drugs or alcohol while taking this medication without discussing with your doctor.  -for sore throat, salt water gargles can help  -follow up if you have fevers, facial pain, tooth pain, difficulty breathing or are worsening or symptoms persist longer then expected  Upper Respiratory Infection, Adult An upper respiratory infection (URI) is also known as the common cold. It is often caused by a type of germ (virus). Colds are easily spread (contagious). You can pass it to others by kissing, coughing, sneezing, or drinking out of the same glass. Usually, you get better in 1 to 3  weeks.  However, the cough can last for even longer. HOME CARE   Only take medicine as told by your doctor. Follow instructions provided above.  Drink enough water and fluids to keep your pee  (urine) clear or pale yellow.  Get plenty of rest.  Return to work when your temperature is < 100 for 24 hours or as told by your doctor. You may use a face mask and wash your hands to stop your cold from spreading. GET HELP RIGHT AWAY IF:   After the first few days, you feel you are getting worse.  You have questions about your medicine.  You have chills, shortness of breath, or red spit (mucus).  You have pain in the face for more then 1-2 days, especially when you bend forward.  You have a fever, puffy (swollen) neck, pain when you swallow, or white spots in the back of your throat.  You have a bad headache, ear pain, sinus pain, or chest pain.  You have a high-pitched whistling sound when you breathe in and out (wheezing).  You cough up blood.  You have sore muscles or a stiff neck. MAKE SURE YOU:   Understand these instructions.  Will watch your condition.  Will get help right away if you are not doing well or get worse. Document Released: 05/22/2008 Document Revised: 02/26/2012 Document Reviewed: 03/11/2014 [facility] Patient Information 2015 [place], [place]. This information is not intended to replace advice given to you by your health care provider. Make sure you discuss any questions you have with your health care provider.

## 2016-04-05 DIAGNOSIS — M8580 Other specified disorders of bone density and structure, unspecified site: Secondary | ICD-10-CM | POA: Diagnosis not present

## 2016-04-26 ENCOUNTER — Other Ambulatory Visit: Payer: Self-pay | Admitting: Internal Medicine

## 2016-05-25 ENCOUNTER — Encounter: Payer: Self-pay | Admitting: Adult Health

## 2016-05-25 ENCOUNTER — Ambulatory Visit (INDEPENDENT_AMBULATORY_CARE_PROVIDER_SITE_OTHER): Payer: Medicare Other | Admitting: Adult Health

## 2016-05-25 VITALS — BP 118/72 | Temp 98.4°F | Wt 163.0 lb

## 2016-05-25 DIAGNOSIS — W57XXXA Bitten or stung by nonvenomous insect and other nonvenomous arthropods, initial encounter: Secondary | ICD-10-CM | POA: Diagnosis not present

## 2016-05-25 DIAGNOSIS — T148 Other injury of unspecified body region: Secondary | ICD-10-CM | POA: Diagnosis not present

## 2016-05-25 DIAGNOSIS — D1801 Hemangioma of skin and subcutaneous tissue: Secondary | ICD-10-CM | POA: Diagnosis not present

## 2016-05-25 DIAGNOSIS — D225 Melanocytic nevi of trunk: Secondary | ICD-10-CM | POA: Diagnosis not present

## 2016-05-25 DIAGNOSIS — L821 Other seborrheic keratosis: Secondary | ICD-10-CM | POA: Diagnosis not present

## 2016-05-25 DIAGNOSIS — L820 Inflamed seborrheic keratosis: Secondary | ICD-10-CM | POA: Diagnosis not present

## 2016-05-25 DIAGNOSIS — D485 Neoplasm of uncertain behavior of skin: Secondary | ICD-10-CM | POA: Diagnosis not present

## 2016-05-25 DIAGNOSIS — Z85828 Personal history of other malignant neoplasm of skin: Secondary | ICD-10-CM | POA: Diagnosis not present

## 2016-05-25 DIAGNOSIS — L700 Acne vulgaris: Secondary | ICD-10-CM | POA: Diagnosis not present

## 2016-05-25 MED ORDER — DOXYCYCLINE HYCLATE 100 MG PO CAPS: 100.0000 mg | ORAL_CAPSULE | Freq: Two times a day (BID) | ORAL | Status: DC

## 2016-05-25 NOTE — Patient Instructions (Addendum)
I have sent in a prescription for Doxycycline. Take this twice a day for 5 days.   If you develop any adverse reactions, stop the medication    Follow up with any signs or symptoms of tick borne illness  Tick Bite Information Ticks are insects that attach themselves to the skin and draw blood for food. There are various types of ticks. Common types include wood ticks and deer ticks. Most ticks live in shrubs and grassy areas. Ticks can climb onto your body when you make contact with leaves or grass where the tick is waiting. The most common places on the body for ticks to attach themselves are the scalp, neck, armpits, waist, and groin. Most tick bites are harmless, but sometimes ticks carry germs that cause diseases. These germs can be spread to a person during the tick's feeding process. The chance of a disease spreading through a tick bite depends on:   The type of tick.  Time of year.   How long the tick is attached.   Geographic location.  HOW CAN YOU PREVENT TICK BITES? Take these steps to help prevent tick bites when you are outdoors:  Wear protective clothing. Long sleeves and long pants are best.   Wear white clothes so you can see ticks more easily.  Tuck your pant legs into your socks.   If walking on a trail, stay in the middle of the trail to avoid brushing against bushes.  Avoid walking through areas with long grass.  Put insect repellent on all exposed skin and along boot tops, pant legs, and sleeve cuffs.   Check clothing, hair, and skin repeatedly and before going inside.   Brush off any ticks that are not attached.  Take a shower or bath as soon as possible after being outdoors.  WHAT IS THE PROPER WAY TO REMOVE A TICK? Ticks should be removed as soon as possible to help prevent diseases caused by tick bites. 1. If latex gloves are available, put them on before trying to remove a tick.  2. Using fine-point tweezers, grasp the tick as close to the  skin as possible. You may also use curved forceps or a tick removal tool. Grasp the tick as close to its head as possible. Avoid grasping the tick on its body. 3. Pull gently with steady upward pressure until the tick lets go. Do not twist the tick or jerk it suddenly. This may break off the tick's head or mouth parts. 4. Do not squeeze or crush the tick's body. This could force disease-carrying fluids from the tick into your body.  5. After the tick is removed, wash the bite area and your hands with soap and water or other disinfectant such as alcohol. 6. Apply a small amount of antiseptic cream or ointment to the bite site.  7. Wash and disinfect any instruments that were used.  Do not try to remove a tick by applying a hot match, petroleum jelly, or fingernail polish to the tick. These methods do not work and may increase the chances of disease being spread from the tick bite.  WHEN SHOULD YOU SEEK MEDICAL CARE? Contact your health care provider if you are unable to remove a tick from your skin or if a part of the tick breaks off and is stuck in the skin.  After a tick bite, you need to be aware of signs and symptoms that could be related to diseases spread by ticks. Contact your health care provider if you  develop any of the following in the days or weeks after the tick bite:  Unexplained fever.  Rash. A circular rash that appears days or weeks after the tick bite may indicate the possibility of Lyme disease. The rash may resemble a target with a bull's-eye and may occur at a different part of your body than the tick bite.  Redness and swelling in the area of the tick bite.   Tender, swollen lymph glands.   Diarrhea.   Weight loss.   Cough.   Fatigue.   Muscle, joint, or bone pain.   Abdominal pain.   Headache.   Lethargy or a change in your level of consciousness.  Difficulty walking or moving your legs.   Numbness in the legs.   Paralysis.  Shortness of  breath.   Confusion.   Repeated vomiting.    This information is not intended to replace advice given to you by your health care provider. Make sure you discuss any questions you have with your health care provider.   Document Released: 12/01/2000 Document Revised: 12/25/2014 Document Reviewed: 05/14/2013 Elsevier Interactive Patient Education [employer].

## 2016-05-25 NOTE — Progress Notes (Signed)
Subjective:    Patient ID: Whitney Beasley, female    DOB: [birth year], 70 y.o.   MRN: [id number]  HPI  71 year old female who presents to the office today for tick bite that she first noticed 3 days ago. She reports that she was able to remove the tick and it appeared to be a deer tick. Over the course of the week she has noticed a bruising circle around the site where she was bit.   Denies any fevers, joint pain, joint swelling to rash.   Review of Systems  Constitutional: Negative.   Respiratory: Negative.   Cardiovascular: Negative.   Musculoskeletal: Negative.   Skin: Positive for color change, rash and wound.  Neurological: Negative.   Hematological: Negative.   All other systems reviewed and are negative.  Past Medical History  Diagnosis Date  . Asthma   . Depression   . Allergy   . GERD (gastroesophageal reflux disease)   . UTI (urinary tract infection)   . Ulcerative colitis [facility])     Social History   Social History  . Marital Status: Married    Spouse Name: N/A  . Number of Children: N/A  . Years of Education: N/A   Occupational History  . Not on file.   Social History Main Topics  . Smoking status: Never Smoker   . Smokeless tobacco: Never Used  . Alcohol Use: 8.4 oz/week    14 Glasses of wine per week  . Drug Use: No  . Sexual Activity:    Partners: Male   Other Topics Concern  . Not on file   Social History Narrative    Past Surgical History  Procedure Laterality Date  . Tonsillectomy Bilateral 1950's    No family history on file.  Allergies  Allergen Reactions  . Minocycline Other (See Comments)    Joint pain and Fatigue    Current Outpatient Prescriptions on File Prior to Visit  Medication Sig Dispense Refill  . B Complex-C (SUPER B COMPLEX PO) Take 1 tablet by mouth daily.    . calcium carbonate 200 MG capsule Take 1,600 mg by mouth daily.     . cephALEXin (KEFLEX) 500 MG capsule Take 500 mg by mouth as needed (acne). Prescribed  by Dermatologist    . Glucosamine-Chondroitin 750-600 MG TABS Take 1 tablet by mouth daily.    Marland Kitchen loratadine (CLARITIN) 10 MG tablet Take 10 mg by mouth daily.    . Multiple Vitamin (MULTIVITAMIN) tablet Take 1 tablet by mouth daily.    . Omega-3 Fatty Acids (OMEGA 3 PO) Take 2 capsules by mouth daily.     Marland Kitchen omeprazole (PRILOSEC) 20 MG capsule Take 20 mg by mouth daily.    . raloxifene (EVISTA) 60 MG tablet Take 60 mg by mouth daily. Prescribed by Dr. Marina Gravel, GYN    . venlafaxine XR (EFFEXOR-XR) 37.5 MG 24 hr capsule TAKE 1 CAPSULE THREE TIMES WEEKLY 39 capsule 0   No current facility-administered medications on file prior to visit.    BP 118/72 mmHg  Temp(Src) 98.4 F (36.9 C) (Oral)  Wt 163 lb (73.936 kg)  LMP  (LMP Unknown)       Objective:   Physical Exam  Constitutional: She is oriented to person, place, and time. She appears well-developed and well-nourished. No distress.  Musculoskeletal: Normal range of motion. She exhibits no edema or tenderness.  Neurological: She is alert and oriented to person, place, and time.  Skin: Skin is warm and dry.  No rash noted. No erythema. No pallor.  Wound from probable tick bite on left inner thigh. No signs of infection. She does have a purple bruise around the site of penetration. This bruise appears in various stages of healing.   Psychiatric: She has a normal mood and affect. Her behavior is normal. Judgment and thought content normal.  Nursing note and vitals reviewed.     Assessment & Plan:  1. Tick bite   - doxycycline (VIBRAMYCIN) 100 MG capsule; Take 1 capsule (100 mg total) by mouth 2 (two) times daily.  Dispense: 10 capsule; Refill: 0 - Information given on tick borne illness and they were advised to follow up with any of these symptoms.  - Follow up with PCP as needed  Dorothyann Peng, NP

## 2016-07-05 ENCOUNTER — Other Ambulatory Visit: Payer: Self-pay | Admitting: Internal Medicine

## 2016-07-06 ENCOUNTER — Other Ambulatory Visit: Payer: Self-pay | Admitting: Obstetrics and Gynecology

## 2016-07-06 DIAGNOSIS — N63 Unspecified lump in unspecified breast: Secondary | ICD-10-CM

## 2016-07-06 NOTE — Telephone Encounter (Signed)
Rx refill sent to pharmacy. 

## 2016-07-07 DIAGNOSIS — S0502XA Injury of conjunctiva and corneal abrasion without foreign body, left eye, initial encounter: Secondary | ICD-10-CM | POA: Diagnosis not present

## 2016-07-31 ENCOUNTER — Ambulatory Visit
Admission: RE | Admit: 2016-07-31 | Discharge: 2016-07-31 | Disposition: A | Discharge status: Home / Self Care | Payer: Medicare Other | Source: Ambulatory Visit | Attending: Obstetrics and Gynecology | Admitting: Obstetrics and Gynecology

## 2016-07-31 DIAGNOSIS — N6489 Other specified disorders of breast: Secondary | ICD-10-CM | POA: Diagnosis not present

## 2016-07-31 DIAGNOSIS — R922 Inconclusive mammogram: Secondary | ICD-10-CM | POA: Diagnosis not present

## 2016-07-31 DIAGNOSIS — N63 Unspecified lump in unspecified breast: Secondary | ICD-10-CM

## 2016-09-11 ENCOUNTER — Other Ambulatory Visit: Payer: Self-pay | Admitting: Internal Medicine

## 2016-10-02 ENCOUNTER — Ambulatory Visit (INDEPENDENT_AMBULATORY_CARE_PROVIDER_SITE_OTHER): Payer: Medicare Other | Admitting: *Deleted

## 2016-10-02 DIAGNOSIS — Z23 Encounter for immunization: Secondary | ICD-10-CM | POA: Diagnosis not present

## 2016-11-23 ENCOUNTER — Ambulatory Visit (INDEPENDENT_AMBULATORY_CARE_PROVIDER_SITE_OTHER): Payer: Medicare Other

## 2016-11-23 ENCOUNTER — Telehealth: Payer: Self-pay

## 2016-11-23 VITALS — BP 144/84 | HR 80 | Ht 65.0 in | Wt 162.6 lb

## 2016-11-23 DIAGNOSIS — Z7289 Other problems related to lifestyle: Secondary | ICD-10-CM

## 2016-11-23 DIAGNOSIS — D485 Neoplasm of uncertain behavior of skin: Secondary | ICD-10-CM | POA: Diagnosis not present

## 2016-11-23 DIAGNOSIS — L821 Other seborrheic keratosis: Secondary | ICD-10-CM | POA: Diagnosis not present

## 2016-11-23 DIAGNOSIS — D225 Melanocytic nevi of trunk: Secondary | ICD-10-CM | POA: Diagnosis not present

## 2016-11-23 DIAGNOSIS — L700 Acne vulgaris: Secondary | ICD-10-CM | POA: Diagnosis not present

## 2016-11-23 DIAGNOSIS — Z0000 Encounter for general adult medical examination without abnormal findings: Secondary | ICD-10-CM | POA: Diagnosis not present

## 2016-11-23 DIAGNOSIS — L814 Other melanin hyperpigmentation: Secondary | ICD-10-CM | POA: Diagnosis not present

## 2016-11-23 DIAGNOSIS — D1801 Hemangioma of skin and subcutaneous tissue: Secondary | ICD-10-CM | POA: Diagnosis not present

## 2016-11-23 MED ORDER — VENLAFAXINE HCL ER 37.5 MG PO CP24: ORAL_CAPSULE | ORAL | 2 refills | Status: DC

## 2016-11-23 NOTE — Progress Notes (Signed)
AWV conducted by Wynetta Fines, RN. Agree with findings and recommendations noted.

## 2016-11-23 NOTE — Telephone Encounter (Signed)
Rx sent to pharmacy   

## 2016-11-23 NOTE — Telephone Encounter (Signed)
IN for AWV Requested refill for venlafaxine; Did schedule CPE for march with blood work;   Please advise tks,

## 2016-11-23 NOTE — Progress Notes (Addendum)
Subjective:   Whitney Beasley is a 70 y.o. female who presents for Medicare Annual (Subsequent) preventive examination.  The Patient was informed that the wellness visit is to identify future health risk and educate and initiate measures that can reduce risk for increased disease through the lifespan.    NO ROS; Medicare Wellness Visit  OV 2016; NEEDS OV for labs OV scheduled   Describes health as good, fair or great?  Good   Preventive Screening -Counseling & Management   BP up today but had ham and cheese omelet  Will plan apt to come back in for CPE Dr. Raliegh Ip   Colonoscopy 05/2014  Not sure why they wanted to repeat but she was told 3 years PGF had colon cancer Father had benign polyps   Dr Joelyn Oms who did colonoscopy stopped her meds  [place]; [facility]  Call to GI office in [place] [phone] and confirmed repeat colonoscopy due 11/07/2017 Multiple BX taken for UC pre informant at the office / no signed release to fax result  NOTE IN epic for 05/2017 but schedule is 3 years    Mammogram; 02/09/2016 found suspicious spot/ was dense breast tissue  Pap 12/2013 / Dr. Orvan Seen;   DEXA Dr. Orvan Seen at the [facility] clinic keeps up with her dexa   currently on Evista daily  Taking Calcium and Vit d  Discussed strength building exercise and to develop a plan   Current smoking/ tobacco status/ never smoked  Second Hand Smoke status; No Smokers in the home   ETOH  Wine and beer approx 4 times a week  Not daily   RISK FACTORS Regular exercise  Joined [facility] [employer]; but has only been x 2  Live next door to mother's and home; planning to move there once renovated  Simply fit board; my try to get back to piliates  Diet BMI 27  Last breakfast and later dinner Cooks and goes out Fairly heart healthy   Fall risk no  Mobility of Functional changes this year? No   Safety community, [place]; staying in older home and the plan is to renovate  one level  wears sunscreen, yes  safe place for firearms;  Motor vehicle accidents; no   Cardiac Risk Factors:  Advanced aged > 79 in men; >65 in women Hyperlipidemia - no notes ; done at [place]  Family History (colon cancer  Obesity negative  Eye exam; august 2017 [facility] in [place] 2: negative Managed well on medication   Activities of Daily Living - See functional screen   Cognitive testing; Ad8 score; 0 or less than 2  MMSE deferred or completed if AD8 + 2 issues  Advanced Directives -deferred   Patient Care Team: Marletta Lor, MD as PCP - General (Internal Medicine)  [place]; Dr Dayton Scrape MD Dr. Lelan Pons; OB- GYN  Dr. Karma Ganja - [facility]  Immunization History  Administered Date(s) Administered  . Influenza, High Dose Seasonal PF 10/08/2014, 10/02/2016  . Td 12/23/2007   Required Immunizations needed today  Screening test up to date or reviewed for plan of completion Health Maintenance Due  Topic Date Due  . Hepatitis C Screening  1946-09-16  . ZOSTAVAX  06/04/2008  . PNA vac Low Risk Adult (1 of 2 - PCV13) 06/04/2013   Agreed to Hep C screening next blood draw  Medicare now request all "baby boomers" test  for possible exposure to Hepatitis C. Many may have been exposed due to dental work, tatoo's, vaccinations when young. The Hepatitis C virus is dormant for many years and then sometimes will cause liver cancer. If you gave blood in the past 15 years, you were most likely checked for Hep C. If you rec'd blood; you may want to consider testing or if you are high risk for any other reason.   Had shingles in [place], Will check the records; within 3 to 70 yo   Pneumonia series; Prevnar 13 / does not know which pneumonia vaccine she had; will try to research the records and update record PCP Dr. Edison Pace at [facility] practice in [place]  Will try to get records if not rec'd    Cardiac Risk Factors include: advanced age (>65mn, >>38women)     Objective:     Vitals: BP (!) 144/84   Pulse 80   Ht 5' 5"  (1.651 m)   Wt 162 lb 9 oz (73.7 kg)   LMP  (LMP Unknown) Comment: 2005  SpO2 98%   BMI 27.05 kg/m   Body mass index is 27.05 kg/m.   Tobacco History  Smoking Status  . Never Smoker  Smokeless Tobacco  . Never Used     Counseling given: Yes   Past Medical History:  Diagnosis Date  . Allergy   . Asthma   . Depression   . GERD (gastroesophageal reflux disease)   . [place]   . UTI (urinary tract infection)    Past Surgical History:  Procedure Laterality Date  . TONSILLECTOMY Bilateral 1950's   No family history on file. History  Sexual Activity  . Sexual activity: Yes  . Partners: Male    Outpatient Encounter Prescriptions as of 11/23/2016  Medication Sig  . B Complex-C (SUPER B COMPLEX PO) Take 1 tablet by mouth daily.  . calcium carbonate 200 MG capsule Take 1,600 mg by mouth daily.   . cephALEXin (KEFLEX) 500 MG capsule Take 500 mg by mouth as needed (acne). Prescribed by Dermatologist  . Glucosamine-Chondroitin 750-600 MG TABS Take 1 tablet by mouth daily.  .Marland Kitchenloratadine (CLARITIN) 10 MG tablet Take 10 mg by mouth daily.  . Multiple Vitamin (MULTIVITAMIN) tablet Take 1 tablet by mouth daily.  . Omega-3 Fatty Acids (OMEGA 3 PO) Take 2 capsules by mouth daily.   .Marland Kitchenomeprazole (PRILOSEC) 20 MG capsule Take 20 mg by mouth daily.  . raloxifene (EVISTA) 60 MG tablet Take 60 mg by mouth daily. Prescribed by Dr. HMarina Gravel GYN  . venlafaxine XR (EFFEXOR-XR) 37.5 MG 24 hr capsule TAKE 1 CAPSULE THREE TIMES WEEKLY (NEEDS WEEKLY). NEED APPT  . [DISCONTINUED] doxycycline (VIBRAMYCIN) 100 MG capsule Take 1 capsule (100 mg total) by mouth 2 (two) times daily. (Patient not taking: Reported on 11/23/2016)   No facility-administered encounter medications on file as of 11/23/2016.     Activities of Daily Living In your  present state of health, do you have any difficulty performing the following activities: 11/23/2016  Hearing? N  Vision? N  Difficulty concentrating or making decisions? N  Walking or climbing stairs? N  Dressing or bathing? N  Doing errands, shopping? N  Preparing Food and eating ? N  Using the Toilet? N  In the past six months, have you accidently leaked urine? N  Do you have problems with loss of bowel control? N  Managing your Medications? N  Managing your Finances? N  Housekeeping or managing your Housekeeping? N  Some recent data might be hidden    Patient Care Team: Marletta Lor, MD as PCP - General (Internal Medicine)    Assessment:     Exercise Activities and Dietary recommendations Current Exercise Habits: Home exercise routine, Time (Minutes): 30, Frequency (Times/Week): 3, Weekly Exercise (Minutes/Week): 90, Intensity: Moderate  Goals    . patient          Will consider exercise routine and establish something that works for you long term       Fall Risk Fall Risk  11/23/2016 12/31/2014  Falls in the past year? No Yes  Number falls in past yr: - 1  Injury with Fall? - No  Risk for fall due to : - Impaired balance/gait   Depression Screen PHQ 2/9 Scores 11/23/2016 12/31/2014  PHQ - 2 Score 0 0     Cognitive Function/ WNL; engaged; under stress but managing         Immunization History  Administered Date(s) Administered  . Influenza, High Dose Seasonal PF 10/08/2014, 10/02/2016  . Td 12/23/2007   Screening Tests Health Maintenance  Topic Date Due  . Hepatitis C Screening  [birth year]  . ZOSTAVAX  06/04/2008  . PNA vac Low Risk Adult (1 of 2 - PCV13) 06/04/2013  . DEXA SCAN  03/16/2017 (Originally 06/04/2013)  . MAMMOGRAM  02/08/2017  . TETANUS/TDAP  12/22/2017  . COLONOSCOPY  05/29/2024  . INFLUENZA VACCINE  Completed      Plan:    PCP Notes  Health Maintenance Updated zostor post visit 12/02/2009 Updated Pneumonia series Prevnar  05/07/2014 (scanned Med Record for dates and vaccines had not been recorded; updated the record and is due PSV 23 )  Updated GI: call to [place]; confirmed repeat due 10/2017 bx to multiples sites secondary to UC No polyps per verbal report  Agreed to have her Hep c next blood draw  Abnormal Screens  none  Patient concerns; would like to have venlafaxine refilled;   Nurse Concerns; none Encouraged to start exercising   Next PCP apt 02/19/2017 for cpe   During the course of the visit the patient was educated and counseled about the following appropriate screening and preventive services:   Vaccines to include Pneumoccal, Influenza, Hepatitis B, Td, Zostavax, HCV  Electrocardiogram  Cardiovascular Disease  Colorectal cancer screening neg  Bone density screening/ osteopenia; followed by GYN  Diabetes screening awaiting blood work, no hx  Glaucoma screening   Mammography/2017   Nutrition counseling   Patient Instructions (the written plan) was given to the patient.   Wynetta Fines, RN  11/23/2016  Medicare wellness visit reviewed and agree with recommendations and findings  Nyoka Cowden

## 2016-11-23 NOTE — Patient Instructions (Addendum)
Whitney Beasley , Thank you for taking time to come for your Medicare Wellness Visit. I appreciate your ongoing commitment to your health goals. Please review the following plan we discussed and let me know if I can assist you in the future.   You will try to get your colonoscopy report; or at least confirm date to be repeated and why?  Whitney Beasley will review chart and outreach for records for pneumonia vaccinations, shingles/  Dr. Orvan Seen is following the DEXA. Mammograms and pap   Will schedule CPE with Dr. Raliegh Ip and will reorder meds and check blood work   These are the goals we discussed: Goals    . patient          Will consider exercise routine and establish something that works for you long term        This is a list of the screening recommended for you and due dates:  Health Maintenance  Topic Date Due  .  Hepatitis C: One time screening is recommended by Center for Disease Control  (CDC) for  adults born from [birth year] through 1963.   1946/05/12  . Shingles Vaccine  06/04/2008  . Pneumonia vaccines (1 of 2 - PCV13) 06/04/2013  . DEXA scan (bone density measurement)  03/16/2017*  . Mammogram  02/08/2017  . Tetanus Vaccine  12/22/2017  . Colon Cancer Screening  05/29/2024  . Flu Shot  Completed  *Topic was postponed. The date shown is not the original due date.        Fall Prevention in the Home Introduction Falls can cause injuries. They can happen to people of all ages. There are many things you can do to make your home safe and to help prevent falls. What can I do on the outside of my home?  Regularly fix the edges of walkways and driveways and fix any cracks.  Remove anything that might make you trip as you walk through a door, such as a raised step or threshold.  Trim any bushes or trees on the path to your home.  Use bright outdoor lighting.  Clear any walking paths of anything that might make someone trip, such as rocks or tools.  Regularly check to see if handrails  are loose or broken. Make sure that both sides of any steps have handrails.  Any raised decks and porches should have guardrails on the edges.  Have any leaves, snow, or ice cleared regularly.  Use sand or salt on walking paths during winter.  Clean up any spills in your garage right away. This includes oil or grease spills. What can I do in the bathroom?  Use night lights.  Install grab bars by the toilet and in the tub and shower. Do not use towel bars as grab bars.  Use non-skid mats or decals in the tub or shower.  If you need to sit down in the shower, use a plastic, non-slip stool.  Keep the floor dry. Clean up any water that spills on the floor as soon as it happens.  Remove soap buildup in the tub or shower regularly.  Attach bath mats securely with double-sided non-slip rug tape.  Do not have throw rugs and other things on the floor that can make you trip. What can I do in the bedroom?  Use night lights.  Make sure that you have a light by your bed that is easy to reach.  Do not use any sheets or blankets that are too big for your bed.  They should not hang down onto the floor.  Have a firm chair that has side arms. You can use this for support while you get dressed.  Do not have throw rugs and other things on the floor that can make you trip. What can I do in the kitchen?  Clean up any spills right away.  Avoid walking on wet floors.  Keep items that you use a lot in easy-to-reach places.  If you need to reach something above you, use a strong step stool that has a grab bar.  Keep electrical cords out of the way.  Do not use floor polish or wax that makes floors slippery. If you must use wax, use non-skid floor wax.  Do not have throw rugs and other things on the floor that can make you trip. What can I do with my stairs?  Do not leave any items on the stairs.  Make sure that there are handrails on both sides of the stairs and use them. Fix handrails  that are broken or loose. Make sure that handrails are as long as the stairways.  Check any carpeting to make sure that it is firmly attached to the stairs. Fix any carpet that is loose or worn.  Avoid having throw rugs at the top or bottom of the stairs. If you do have throw rugs, attach them to the floor with carpet tape.  Make sure that you have a light switch at the top of the stairs and the bottom of the stairs. If you do not have them, ask someone to add them for you. What else can I do to help prevent falls?  Wear shoes that:  Do not have high heels.  Have rubber bottoms.  Are comfortable and fit you well.  Are closed at the toe. Do not wear sandals.  If you use a stepladder:  Make sure that it is fully opened. Do not climb a closed stepladder.  Make sure that both sides of the stepladder are locked into place.  Ask someone to hold it for you, if possible.  Clearly mark and make sure that you can see:  Any grab bars or handrails.  First and last steps.  Where the edge of each step is.  Use tools that help you move around (mobility aids) if they are needed. These include:  Canes.  Walkers.  Scooters.  Crutches.  Turn on the lights when you go into a dark area. Replace any light bulbs as soon as they burn out.  Set up your furniture so you have a clear path. Avoid moving your furniture around.  If any of your floors are uneven, fix them.  If there are any pets around you, be aware of where they are.  Review your medicines with your doctor. Some medicines can make you feel dizzy. This can increase your chance of falling. Ask your doctor what other things that you can do to help prevent falls. This information is not intended to replace advice given to you by your health care provider. Make sure you discuss any questions you have with your health care provider. Document Released: 09/30/2009 Document Revised: 05/11/2016 Document Reviewed: 01/08/2015  2017  [employer]vier  Health Maintenance, Female Introduction Adopting a healthy lifestyle and getting preventive care can go a long way to promote health and wellness. Talk with your health care provider about what schedule of regular examinations is right for you. This is a good chance for you to check in with your  provider about disease prevention and staying healthy. In between checkups, there are plenty of things you can do on your own. Experts have done a lot of research about which lifestyle changes and preventive measures are most likely to keep you healthy. Ask your health care provider for more information. Weight and diet Eat a healthy diet  Be sure to include plenty of vegetables, fruits, low-fat dairy products, and lean protein.  Do not eat a lot of foods high in solid fats, added sugars, or salt.  Get regular exercise. This is one of the most important things you can do for your health.  Most adults should exercise for at least 150 minutes each week. The exercise should increase your heart rate and make you sweat (moderate-intensity exercise).  Most adults should also do strengthening exercises at least twice a week. This is in addition to the moderate-intensity exercise. Maintain a healthy weight  Body mass index (BMI) is a measurement that can be used to identify possible weight problems. It estimates body fat based on height and weight. Your health care provider can help determine your BMI and help you achieve or maintain a healthy weight.  For females 42 years of age and older:  A BMI below 18.5 is considered underweight.  A BMI of 18.5 to 24.9 is normal.  A BMI of 25 to 29.9 is considered overweight.  A BMI of 30 and above is considered obese. Watch levels of cholesterol and blood lipids  You should start having your blood tested for lipids and cholesterol at 68 years of age, then have this test every 5 years.  You may need to have your cholesterol levels checked more often  if:  Your lipid or cholesterol levels are high.  You are older than 68 years of age.  You are at high risk for heart disease. Cancer screening Lung Cancer  Lung cancer screening is recommended for adults 59-78 years old who are at high risk for lung cancer because of a history of smoking.  A yearly low-dose CT scan of the lungs is recommended for people who:  Currently smoke.  Have quit within the past 15 years.  Have at least a 30-pack-year history of smoking. A pack year is smoking an average of one pack of cigarettes a day for 1 year.  Yearly screening should continue until it has been 15 years since you quit.  Yearly screening should stop if you develop a health problem that would prevent you from having lung cancer treatment. Breast Cancer  Practice breast self-awareness. This means understanding how your breasts normally appear and feel.  It also means doing regular breast self-exams. Let your health care provider know about any changes, no matter how small.  If you are in your 20s or 30s, you should have a clinical breast exam (CBE) by a health care provider every 1-3 years as part of a regular health exam.  If you are 62 or older, have a CBE every year. Also consider having a breast X-ray (mammogram) every year.  If you have a family history of breast cancer, talk to your health care provider about genetic screening.  If you are at high risk for breast cancer, talk to your health care provider about having an MRI and a mammogram every year.  Breast cancer gene (BRCA) assessment is recommended for women who have family members with BRCA-related cancers. BRCA-related cancers include:  Breast.  Ovarian.  Tubal.  Peritoneal cancers.  Results of the assessment will  determine the need for genetic counseling and BRCA1 and BRCA2 testing. Cervical Cancer  Your health care provider may recommend that you be screened regularly for cancer of the pelvic organs (ovaries,  uterus, and vagina). This screening involves a pelvic examination, including checking for microscopic changes to the surface of your cervix (Pap test). You may be encouraged to have this screening done every 3 years, beginning at age 87.  For women ages 64-65, health care providers may recommend pelvic exams and Pap testing every 3 years, or they may recommend the Pap and pelvic exam, combined with testing for human papilloma virus (HPV), every 5 years. Some types of HPV increase your risk of cervical cancer. Testing for HPV may also be done on women of any age with unclear Pap test results.  Other health care providers may not recommend any screening for nonpregnant women who are considered low risk for pelvic cancer and who do not have symptoms. Ask your health care provider if a screening pelvic exam is right for you.  If you have had past treatment for cervical cancer or a condition that could lead to cancer, you need Pap tests and screening for cancer for at least 20 years after your treatment. If Pap tests have been discontinued, your risk factors (such as having a new sexual partner) need to be reassessed to determine if screening should resume. Some women have medical problems that increase the chance of getting cervical cancer. In these cases, your health care provider may recommend more frequent screening and Pap tests. Colorectal Cancer  This type of cancer can be detected and often prevented.  Routine colorectal cancer screening usually begins at 68 years of age and continues through 68 years of age.  Your health care provider may recommend screening at an earlier age if you have risk factors for colon cancer.  Your health care provider may also recommend using home test kits to check for hidden blood in the stool.  A small camera at the end of a tube can be used to examine your colon directly (sigmoidoscopy or colonoscopy). This is done to check for the earliest forms of colorectal  cancer.  Routine screening usually begins at age 65.  Direct examination of the colon should be repeated every 5-10 years through 69 years of age. However, you may need to be screened more often if early forms of precancerous polyps or small growths are found. Skin Cancer  Check your skin from head to toe regularly.  Tell your health care provider about any new moles or changes in moles, especially if there is a change in a mole's shape or color.  Also tell your health care provider if you have a mole that is larger than the size of a pencil eraser.  Always use sunscreen. Apply sunscreen liberally and repeatedly throughout the day.  Protect yourself by wearing long sleeves, pants, a wide-brimmed hat, and sunglasses whenever you are outside. Heart disease, diabetes, and high blood pressure  High blood pressure causes heart disease and increases the risk of stroke. High blood pressure is more likely to develop in:  People who have blood pressure in the high end of the normal range (130-139/85-89 mm Hg).  People who are overweight or obese.  People who are African American.  If you are 51-86 years of age, have your blood pressure checked every 3-5 years. If you are 87 years of age or older, have your blood pressure checked every year. You should have your blood  pressure measured twice-once when you are at a hospital or clinic, and once when you are not at a hospital or clinic. Record the average of the two measurements. To check your blood pressure when you are not at a hospital or clinic, you can use:  An automated blood pressure machine at a pharmacy.  A home blood pressure monitor.  If you are between 23 years and 75 years old, ask your health care provider if you should take aspirin to prevent strokes.  Have regular diabetes screenings. This involves taking a blood sample to check your fasting blood sugar level.  If you are at a normal weight and have a low risk for diabetes,  have this test once every three years after 68 years of age.  If you are overweight and have a high risk for diabetes, consider being tested at a younger age or more often. Preventing infection Hepatitis B  If you have a higher risk for hepatitis B, you should be screened for this virus. You are considered at high risk for hepatitis B if:  You were born in a country where hepatitis B is common. Ask your health care provider which countries are considered high risk.  Your parents were born in a high-risk country, and you have not been immunized against hepatitis B (hepatitis B vaccine).  You have HIV or AIDS.  You use needles to inject street drugs.  You live with someone who has hepatitis B.  You have had sex with someone who has hepatitis B.  You get hemodialysis treatment.  You take certain medicines for conditions, including cancer, organ transplantation, and autoimmune conditions. Hepatitis C  Blood testing is recommended for:  Everyone born from [birth year] through 1963.  Anyone with known risk factors for hepatitis C. Sexually transmitted infections (STIs)  You should be screened for sexually transmitted infections (STIs) including gonorrhea and chlamydia if:  You are sexually active and are younger than 69 years of age.  You are older than 68 years of age and your health care provider tells you that you are at risk for this type of infection.  Your sexual activity has changed since you were last screened and you are at an increased risk for chlamydia or gonorrhea. Ask your health care provider if you are at risk.  If you do not have HIV, but are at risk, it may be recommended that you take a prescription medicine daily to prevent HIV infection. This is called pre-exposure prophylaxis (PrEP). You are considered at risk if:  You are sexually active and do not regularly use condoms or know the HIV status of your partner(s).  You take drugs by injection.  You are sexually  active with a partner who has HIV. Talk with your health care provider about whether you are at high risk of being infected with HIV. If you choose to begin PrEP, you should first be tested for HIV. You should then be tested every 3 months for as long as you are taking PrEP. Pregnancy  If you are premenopausal and you may become pregnant, ask your health care provider about preconception counseling.  If you may become pregnant, take 400 to 800 micrograms (mcg) of folic acid every day.  If you want to prevent pregnancy, talk to your health care provider about birth control (contraception). Osteoporosis and menopause  Osteoporosis is a disease in which the bones lose minerals and strength with aging. This can result in serious bone fractures. Your risk for osteoporosis can  be identified using a bone density scan.  If you are 82 years of age or older, or if you are at risk for osteoporosis and fractures, ask your health care provider if you should be screened.  Ask your health care provider whether you should take a calcium or vitamin D supplement to lower your risk for osteoporosis.  Menopause may have certain physical symptoms and risks.  Hormone replacement therapy may reduce some of these symptoms and risks. Talk to your health care provider about whether hormone replacement therapy is right for you. Follow these instructions at home:  Schedule regular health, dental, and eye exams.  Stay current with your immunizations.  Do not use any tobacco products including cigarettes, chewing tobacco, or electronic cigarettes.  If you are pregnant, do not drink alcohol.  If you are breastfeeding, limit how much and how often you drink alcohol.  Limit alcohol intake to no more than 1 drink per day for nonpregnant women. One drink equals 12 ounces of beer, 5 ounces of wine, or 1 ounces of hard liquor.  Do not use street drugs.  Do not share needles.  Ask your health care provider for  help if you need support or information about quitting drugs.  Tell your health care provider if you often feel depressed.  Tell your health care provider if you have ever been abused or do not feel safe at home. This information is not intended to replace advice given to you by your health care provider. Make sure you discuss any questions you have with your health care provider. Document Released: 06/19/2011 Document Revised: 05/11/2016 Document Reviewed: 09/07/2015  2017 [employer]vier

## 2016-11-28 ENCOUNTER — Telehealth: Payer: Self-pay | Admitting: Internal Medicine

## 2016-11-28 NOTE — Telephone Encounter (Signed)
Pt had wellness exam last week and has some info to share with you. Would like a call back.

## 2016-12-12 NOTE — Telephone Encounter (Signed)
Call back to the patient Stated she was able to reach her prior MD  Was confirmed her colonoscopy is due 03/2017 The offices states they did not receive Medical record release,resending form to patient to send. Will note colonoscopy due on next apt note

## 2016-12-27 ENCOUNTER — Other Ambulatory Visit: Payer: Self-pay | Admitting: Obstetrics and Gynecology

## 2016-12-27 DIAGNOSIS — N6489 Other specified disorders of breast: Secondary | ICD-10-CM

## 2016-12-27 DIAGNOSIS — N63 Unspecified lump in unspecified breast: Secondary | ICD-10-CM

## 2017-01-25 ENCOUNTER — Encounter: Payer: Self-pay | Admitting: Internal Medicine

## 2017-01-25 ENCOUNTER — Ambulatory Visit (INDEPENDENT_AMBULATORY_CARE_PROVIDER_SITE_OTHER): Payer: Medicare Other | Admitting: Internal Medicine

## 2017-01-25 VITALS — BP 136/68 | HR 92 | Temp 98.4°F | Ht 65.0 in | Wt 159.2 lb

## 2017-01-25 DIAGNOSIS — J069 Acute upper respiratory infection, unspecified: Secondary | ICD-10-CM

## 2017-01-25 DIAGNOSIS — B9789 Other viral agents as the cause of diseases classified elsewhere: Secondary | ICD-10-CM | POA: Diagnosis not present

## 2017-01-25 MED ORDER — VENLAFAXINE HCL ER 37.5 MG PO CP24: ORAL_CAPSULE | ORAL | 1 refills | Status: DC

## 2017-01-25 NOTE — Progress Notes (Signed)
Pre visit review using our clinic review tool, if applicable. No additional management support is needed unless otherwise documented below in the visit note. 

## 2017-01-25 NOTE — Patient Instructions (Addendum)
Viral URI with coughAcute bronchitis symptoms for less than 10 days are generally not helped by antibiotics.  Take over-the-counter expectorants and cough medications such as  Mucinex DM.  Call if there is no improvement in 5 to 7 days or if  you develop worsening cough, fever, or new symptoms, such as shortness of breath or chest pain.  Hydrate and Humidify  Drink enough water to keep your urine clear or pale yellow. Staying hydrated will help to thin your mucus.  Use a cool mist humidifier to keep the humidity level in your home above 50%.  Inhale steam for 10-15 minutes, 3-4 times a day or as told by your health care provider. You can do this in the bathroom while a hot shower is running.  Limit your exposure to cool or dry air. Rest  Rest as much as possible.

## 2017-01-25 NOTE — Progress Notes (Signed)
Subjective:    Patient ID: Whitney Beasley, female    DOB: [birth year], 71 y.o.   MRN: [id number]  HPI 71 year old patient who enjoys excellent health.  She presents with a seven-day history of sore throat, cough and congestion.  She has had some low-grade fever and fatigue.  She was concerned about pneumonia.  She has been using Tylenol and neck somewhat dorsally.  Past Medical History:  Diagnosis Date  . Allergy   . Asthma   . Depression   . GERD (gastroesophageal reflux disease)   . Ulcerative colitis ([place])   . UTI (urinary tract infection)      Social History   Social History  . Marital status: Married    Spouse name: N/A  . Number of children: N/A  . Years of education: N/A   Occupational History  . Not on file.   Social History Main Topics  . Smoking status: Never Smoker  . Smokeless tobacco: Never Used  . Alcohol use 8.4 oz/week    14 Glasses of wine per week     Comment: 4 ETOH a week; beer or wine / sometimes more than 1   . Drug use: No  . Sexual activity: Yes    Partners: Male   Other Topics Concern  . Not on file   Social History Narrative  . No narrative on file    Past Surgical History:  Procedure Laterality Date  . TONSILLECTOMY Bilateral 1950's    No family history on file.  Allergies  Allergen Reactions  . Minocycline Other (See Comments)    Joint pain and Fatigue    Current Outpatient Prescriptions on File Prior to Visit  Medication Sig Dispense Refill  . B Complex-C (SUPER B COMPLEX PO) Take 1 tablet by mouth daily.    . calcium carbonate 200 MG capsule Take 1,600 mg by mouth daily.     . cephALEXin (KEFLEX) 500 MG capsule Take 500 mg by mouth as needed (acne). Prescribed by Dermatologist    . Glucosamine-Chondroitin 750-600 MG TABS Take 1 tablet by mouth daily.    Marland Kitchen loratadine (CLARITIN) 10 MG tablet Take 10 mg by mouth daily.    . Multiple Vitamin (MULTIVITAMIN) tablet Take 1 tablet by mouth daily.    . Omega-3 Fatty Acids (OMEGA 3  PO) Take 2 capsules by mouth daily.     Marland Kitchen omeprazole (PRILOSEC) 20 MG capsule Take 20 mg by mouth daily.    . raloxifene (EVISTA) 60 MG tablet Take 60 mg by mouth daily. Prescribed by Dr. Marina Gravel, GYN     No current facility-administered medications on file prior to visit.     BP 136/68 (BP Location: Left Arm, Patient Position: Sitting, Cuff Size: Normal)   Pulse 92   Temp 98.4 F (36.9 C) (Oral)   Ht 5' 5"  (1.651 m)   Wt 159 lb 3.2 oz (72.2 kg)   LMP  (LMP Unknown) Comment: 2005  SpO2 97%   BMI 26.49 kg/m      Review of Systems  Constitutional: Positive for activity change, appetite change, fatigue and fever.  HENT: Negative for congestion, dental problem, hearing loss, rhinorrhea, sinus pressure, sore throat and tinnitus.   Eyes: Negative for pain, discharge and visual disturbance.  Respiratory: Positive for cough. Negative for shortness of breath.   Cardiovascular: Negative for chest pain, palpitations and leg swelling.  Gastrointestinal: Negative for abdominal distention, abdominal pain, blood in stool, constipation, diarrhea, nausea and vomiting.  Genitourinary: Negative for difficulty urinating,  dysuria, flank pain, frequency, hematuria, pelvic pain, urgency, vaginal bleeding, vaginal discharge and vaginal pain.  Musculoskeletal: Negative for arthralgias, gait problem and joint swelling.  Skin: Negative for rash.  Neurological: Negative for dizziness, syncope, speech difficulty, weakness, numbness and headaches.  Hematological: Negative for adenopathy.  Psychiatric/Behavioral: Negative for agitation, behavioral problems and dysphoric mood. The patient is not nervous/anxious.        Objective:   Physical Exam  Constitutional: She is oriented to person, place, and time. She appears well-developed and well-nourished.  HENT:  Head: Normocephalic.  Right Ear: External ear normal.  Left Ear: External ear normal.  Mouth/Throat: Oropharynx is clear and moist.  Eyes:  Conjunctivae and EOM are normal. Pupils are equal, round, and reactive to light.  Neck: Normal range of motion. Neck supple. No thyromegaly present.  Cardiovascular: Normal rate, regular rhythm, normal heart sounds and intact distal pulses.   Pulmonary/Chest: Effort normal and breath sounds normal. No respiratory distress. She has no wheezes. She has no rales.  Rare scattered rhonchi.  No distress.  O2 saturation 97%  Abdominal: Soft. Bowel sounds are normal. She exhibits no mass. There is no tenderness.  Musculoskeletal: Normal range of motion.  Lymphadenopathy:    She has no cervical adenopathy.  Neurological: She is alert and oriented to person, place, and time.  Skin: Skin is warm and dry. No rash noted.  Psychiatric: She has a normal mood and affect. Her behavior is normal.          Assessment & Plan:   Viral URI with cough.  Will treat symptomatically CPX next month as scheduled  Nyoka Cowden

## 2017-02-08 ENCOUNTER — Other Ambulatory Visit: Payer: Medicare Other

## 2017-02-12 ENCOUNTER — Other Ambulatory Visit: Payer: Medicare Other

## 2017-02-19 ENCOUNTER — Encounter: Payer: Medicare Other | Admitting: Internal Medicine

## 2017-02-21 ENCOUNTER — Ambulatory Visit
Admission: RE | Admit: 2017-02-21 | Discharge: 2017-02-21 | Disposition: A | Discharge status: Home / Self Care | Payer: Medicare Other | Source: Ambulatory Visit | Attending: Obstetrics and Gynecology | Admitting: Obstetrics and Gynecology

## 2017-02-21 DIAGNOSIS — N6489 Other specified disorders of breast: Secondary | ICD-10-CM

## 2017-02-21 DIAGNOSIS — R928 Other abnormal and inconclusive findings on diagnostic imaging of breast: Secondary | ICD-10-CM | POA: Diagnosis not present

## 2017-03-30 ENCOUNTER — Ambulatory Visit (INDEPENDENT_AMBULATORY_CARE_PROVIDER_SITE_OTHER): Payer: Medicare Other | Admitting: Internal Medicine

## 2017-03-30 ENCOUNTER — Encounter: Payer: Self-pay | Admitting: Internal Medicine

## 2017-03-30 VITALS — BP 122/64 | HR 77 | Temp 98.1°F | Ht 65.0 in | Wt 159.0 lb

## 2017-03-30 DIAGNOSIS — K5100 Ulcerative (chronic) pancolitis without complications: Secondary | ICD-10-CM | POA: Diagnosis not present

## 2017-03-30 DIAGNOSIS — Z23 Encounter for immunization: Secondary | ICD-10-CM | POA: Diagnosis not present

## 2017-03-30 DIAGNOSIS — M810 Age-related osteoporosis without current pathological fracture: Secondary | ICD-10-CM | POA: Insufficient documentation

## 2017-03-30 DIAGNOSIS — M818 Other osteoporosis without current pathological fracture: Secondary | ICD-10-CM | POA: Diagnosis not present

## 2017-03-30 DIAGNOSIS — F4323 Adjustment disorder with mixed anxiety and depressed mood: Secondary | ICD-10-CM | POA: Diagnosis not present

## 2017-03-30 DIAGNOSIS — M85851 Other specified disorders of bone density and structure, right thigh: Secondary | ICD-10-CM | POA: Insufficient documentation

## 2017-03-30 DIAGNOSIS — K5190 Ulcerative colitis, unspecified, without complications: Secondary | ICD-10-CM | POA: Insufficient documentation

## 2017-03-30 DIAGNOSIS — E785 Hyperlipidemia, unspecified: Secondary | ICD-10-CM | POA: Diagnosis not present

## 2017-03-30 LAB — TSH: TSH: 2.01 u[IU]/mL (ref 0.35–4.50)

## 2017-03-30 LAB — CBC WITH DIFFERENTIAL/PLATELET
Basophils Absolute: 0.1 10*3/uL (ref 0.0–0.1)
Basophils Relative: 1.7 % (ref 0.0–3.0)
EOS PCT: 3.8 % (ref 0.0–5.0)
Eosinophils Absolute: 0.2 10*3/uL (ref 0.0–0.7)
HCT: 40.6 % (ref 36.0–46.0)
HEMOGLOBIN: 13.6 g/dL (ref 12.0–15.0)
Lymphocytes Relative: 36 % (ref 12.0–46.0)
Lymphs Abs: 2 10*3/uL (ref 0.7–4.0)
MCHC: 33.5 g/dL (ref 30.0–36.0)
MCV: 94 fl (ref 78.0–100.0)
MONOS PCT: 9.7 % (ref 3.0–12.0)
Monocytes Absolute: 0.5 10*3/uL (ref 0.1–1.0)
Neutro Abs: 2.7 10*3/uL (ref 1.4–7.7)
Neutrophils Relative %: 48.8 % (ref 43.0–77.0)
Platelets: 226 10*3/uL (ref 150.0–400.0)
RBC: 4.32 Mil/uL (ref 3.87–5.11)
RDW: 14.2 % (ref 11.5–15.5)
WBC: 5.6 10*3/uL (ref 4.0–10.5)

## 2017-03-30 LAB — COMPREHENSIVE METABOLIC PANEL
ALBUMIN: 4.3 g/dL (ref 3.5–5.2)
ALK PHOS: 65 U/L (ref 39–117)
ALT: 17 U/L (ref 0–35)
AST: 19 U/L (ref 0–37)
BUN: 20 mg/dL (ref 6–23)
CO2: 29 mEq/L (ref 19–32)
Calcium: 9.6 mg/dL (ref 8.4–10.5)
Chloride: 106 mEq/L (ref 96–112)
Creatinine, Ser: 0.83 mg/dL (ref 0.40–1.20)
GFR: 72.48 mL/min (ref >60.00)
GLUCOSE: 100 mg/dL — AB (ref 70–99)
POTASSIUM: 3.9 meq/L (ref 3.5–5.1)
Sodium: 141 mEq/L (ref 135–145)
TOTAL PROTEIN: 6.6 g/dL (ref 6.0–8.3)
Total Bilirubin: 0.8 mg/dL (ref 0.2–1.2)

## 2017-03-30 LAB — LIPID PANEL
Cholesterol: 230 mg/dL — ABNORMAL HIGH (ref 0–200)
HDL: 76.9 mg/dL (ref >39.00)
LDL Cholesterol: 132 mg/dL — ABNORMAL HIGH (ref 0–99)
NONHDL: 153.39
Total CHOL/HDL Ratio: 3
Triglycerides: 109 mg/dL (ref 0.0–149.0)
VLDL: 21.8 mg/dL (ref 0.0–40.0)

## 2017-03-30 NOTE — Patient Instructions (Addendum)
WE NOW OFFER   [facility] FAST TRACK!!!  SAME DAY Appointments for ACUTE CARE  Such as: Sprains, Injuries, cuts, abrasions, rashes, muscle pain, joint pain, back pain Colds, flu, sore throats, headache, allergies, cough, fever  Ear pain, sinus and eye infections Abdominal pain, nausea, vomiting, diarrhea, upset stomach Animal/insect bites  3 Easy Ways to Schedule: Walk-In Scheduling Call in scheduling Mychart Sign-up: [email or web address]      It is important that you exercise regularly, at least 20 minutes 3 to 4 times per week.  If you develop chest pain or shortness of breath seek  medical attention.  Take a calcium supplement, plus [phone] units of vitamin D      Health Maintenance for Postmenopausal Women Menopause is a normal process in which your reproductive ability comes to an end. This process happens gradually over a span of months to years, usually between the ages of 58 and 35. Menopause is complete when you have missed 12 consecutive menstrual periods. It is important to talk with your health care provider about some of the most common conditions that affect postmenopausal women, such as heart disease, cancer, and bone loss (osteoporosis). Adopting a healthy lifestyle and getting preventive care can help to promote your health and wellness. Those actions can also lower your chances of developing some of these common conditions. What should I know about menopause? During menopause, you may experience a number of symptoms, such as:  Moderate-to-severe hot flashes.  Night sweats.  Decrease in sex drive.  Mood swings.  Headaches.  Tiredness.  Irritability.  Memory problems.  Insomnia. Choosing to treat or not to treat menopausal changes is an individual decision that you make with your health care provider. What should I know about hormone replacement therapy and supplements? Hormone therapy products are effective for treating  symptoms that are associated with menopause, such as hot flashes and night sweats. Hormone replacement carries certain risks, especially as you become older. If you are thinking about using estrogen or estrogen with progestin treatments, discuss the benefits and risks with your health care provider. What should I know about heart disease and stroke? Heart disease, heart attack, and stroke become more likely as you age. This may be due, in part, to the hormonal changes that your body experiences during menopause. These can affect how your body processes dietary fats, triglycerides, and cholesterol. Heart attack and stroke are both medical emergencies. There are many things that you can do to help prevent heart disease and stroke:  Have your blood pressure checked at least every 1-2 years. High blood pressure causes heart disease and increases the risk of stroke.  If you are 22-41 years old, ask your health care provider if you should take aspirin to prevent a heart attack or a stroke.  Do not use any tobacco products, including cigarettes, chewing tobacco, or electronic cigarettes. If you need help quitting, ask your health care provider.  It is important to eat a healthy diet and maintain a healthy weight.  Be sure to include plenty of vegetables, fruits, low-fat dairy products, and lean protein.  Avoid eating foods that are high in solid fats, added sugars, or salt (sodium).  Get regular exercise. This is one of the most important things that you can do for your health.  Try to exercise for at least 150 minutes each week. The type of exercise that you do should increase your heart rate and make you sweat. This is known as moderate-intensity exercise.  Try to do  strengthening exercises at least twice each week. Do these in addition to the moderate-intensity exercise.  Know your numbers.Ask your health care provider to check your cholesterol and your blood glucose. Continue to have your blood  tested as directed by your health care provider. What should I know about cancer screening? There are several types of cancer. Take the following steps to reduce your risk and to catch any cancer development as early as possible. Breast Cancer  Practice breast self-awareness.  This means understanding how your breasts normally appear and feel.  It also means doing regular breast self-exams. Let your health care provider know about any changes, no matter how small.  If you are 67 or older, have a clinician do a breast exam (clinical breast exam or CBE) every year. Depending on your age, family history, and medical history, it may be recommended that you also have a yearly breast X-ray (mammogram).  If you have a family history of breast cancer, talk with your health care provider about genetic screening.  If you are at high risk for breast cancer, talk with your health care provider about having an MRI and a mammogram every year.  Breast cancer (BRCA) gene test is recommended for women who have family members with BRCA-related cancers. Results of the assessment will determine the need for genetic counseling and BRCA1 and for BRCA2 testing. BRCA-related cancers include these types:  Breast. This occurs in males or females.  Ovarian.  Tubal. This may also be called fallopian tube cancer.  Cancer of the abdominal or pelvic lining (peritoneal cancer).  Prostate.  Pancreatic. Cervical, Uterine, and Ovarian Cancer  Your health care provider may recommend that you be screened regularly for cancer of the pelvic organs. These include your ovaries, uterus, and vagina. This screening involves a pelvic exam, which includes checking for microscopic changes to the surface of your cervix (Pap test).  For women ages 21-65, health care providers may recommend a pelvic exam and a Pap test every three years. For women ages 67-65, they may recommend the Pap test and pelvic exam, combined with testing for  human papilloma virus (HPV), every five years. Some types of HPV increase your risk of cervical cancer. Testing for HPV may also be done on women of any age who have unclear Pap test results.  Other health care providers may not recommend any screening for nonpregnant women who are considered low risk for pelvic cancer and have no symptoms. Ask your health care provider if a screening pelvic exam is right for you.  If you have had past treatment for cervical cancer or a condition that could lead to cancer, you need Pap tests and screening for cancer for at least 20 years after your treatment. If Pap tests have been discontinued for you, your risk factors (such as having a new sexual partner) need to be reassessed to determine if you should start having screenings again. Some women have medical problems that increase the chance of getting cervical cancer. In these cases, your health care provider may recommend that you have screening and Pap tests more often.  If you have a family history of uterine cancer or ovarian cancer, talk with your health care provider about genetic screening.  If you have vaginal bleeding after reaching menopause, tell your health care provider.  There are currently no reliable tests available to screen for ovarian cancer. Lung Cancer  Lung cancer screening is recommended for adults 65-64 years old who are at high risk for  lung cancer because of a history of smoking. A yearly low-dose CT scan of the lungs is recommended if you:  Currently smoke.  Have a history of at least 30 pack-years of smoking and you currently smoke or have quit within the past 15 years. A pack-year is smoking an average of one pack of cigarettes per day for one year. Yearly screening should:  Continue until it has been 15 years since you quit.  Stop if you develop a health problem that would prevent you from having lung cancer treatment. Colorectal Cancer  This type of cancer can be detected and  can often be prevented.  Routine colorectal cancer screening usually begins at age 49 and continues through age 59.  If you have risk factors for colon cancer, your health care provider may recommend that you be screened at an earlier age.  If you have a family history of colorectal cancer, talk with your health care provider about genetic screening.  Your health care provider may also recommend using home test kits to check for hidden blood in your stool.  A small camera at the end of a tube can be used to examine your colon directly (sigmoidoscopy or colonoscopy). This is done to check for the earliest forms of colorectal cancer.  Direct examination of the colon should be repeated every 5-10 years until age 17. However, if early forms of precancerous polyps or small growths are found or if you have a family history or genetic risk for colorectal cancer, you may need to be screened more often. Skin Cancer  Check your skin from head to toe regularly.  Monitor any moles. Be sure to tell your health care provider:  About any new moles or changes in moles, especially if there is a change in a mole's shape or color.  If you have a mole that is larger than the size of a pencil eraser.  If any of your family members has a history of skin cancer, especially at a young age, talk with your health care provider about genetic screening.  Always use sunscreen. Apply sunscreen liberally and repeatedly throughout the day.  Whenever you are outside, protect yourself by wearing long sleeves, pants, a wide-brimmed hat, and sunglasses. What should I know about osteoporosis? Osteoporosis is a condition in which bone destruction happens more quickly than new bone creation. After menopause, you may be at an increased risk for osteoporosis. To help prevent osteoporosis or the bone fractures that can happen because of osteoporosis, the following is recommended:  If you are 40-60 years old, get at least 1,000  mg of calcium and at least 600 mg of vitamin D per day.  If you are older than age 46 but younger than age 18, get at least 1,200 mg of calcium and at least 600 mg of vitamin D per day.  If you are older than age 38, get at least 1,200 mg of calcium and at least 800 mg of vitamin D per day. Smoking and excessive alcohol intake increase the risk of osteoporosis. Eat foods that are rich in calcium and vitamin D, and do weight-bearing exercises several times each week as directed by your health care provider. What should I know about how menopause affects my mental health? Depression may occur at any age, but it is more common as you become older. Common symptoms of depression include:  Low or sad mood.  Changes in sleep patterns.  Changes in appetite or eating patterns.  Feeling an overall  lack of motivation or enjoyment of activities that you previously enjoyed.  Frequent crying spells. Talk with your health care provider if you think that you are experiencing depression. What should I know about immunizations? It is important that you get and maintain your immunizations. These include:  Tetanus, diphtheria, and pertussis (Tdap) booster vaccine.  Influenza every year before the flu season begins.  Pneumonia vaccine.  Shingles vaccine. Your health care provider may also recommend other immunizations. This information is not intended to replace advice given to you by your health care provider. Make sure you discuss any questions you have with your health care provider. Document Released: 01/26/2006 Document Revised: 06/23/2016 Document Reviewed: 09/07/2015 Elsevier Interactive Patient Education  2017 [employer].

## 2017-03-30 NOTE — Progress Notes (Signed)
Subjective:    Patient ID: Whitney Beasley, female    DOB: Aug 11, 1946, 71 y.o.   MRN: [id number]  HPI  71 year old patient who is seen today for a annual assessment and exam.  She has had a recent Medicare She continues to do quite well.   .  She is scheduled to see GYN  Soon and has had a recent mammogram. She has a history reflux and uses.  Doing quite well She has a history of ulcerative  Colitis and last colonoscopy 2015.  Biopsies were negative, and Pentasa discontinued.  She continues to do well  Past Medical History:  Diagnosis Date  . Allergy   . Asthma   . Depression   . GERD (gastroesophageal reflux disease)   . Ulcerative colitis ([place])   . UTI (urinary tract infection)      Social History   Social History  . Marital status: Married    Spouse name: N/A  . Number of children: N/A  . Years of education: N/A   Occupational History  . Not on file.   Social History Main Topics  . Smoking status: Never Smoker  . Smokeless tobacco: Never Used  . Alcohol use 8.4 oz/week    14 Glasses of wine per week     Comment: 4 ETOH a week; beer or wine / sometimes more than 1   . Drug use: No  . Sexual activity: Yes    Partners: Male   Other Topics Concern  . Not on file   Social History Narrative  . No narrative on file    Past Surgical History:  Procedure Laterality Date  . TONSILLECTOMY Bilateral 1950's    No family history on file.  Allergies  Allergen Reactions  . Minocycline Other (See Comments)    Joint pain and Fatigue    Current Outpatient Prescriptions on File Prior to Visit  Medication Sig Dispense Refill  . B Complex-C (SUPER B COMPLEX PO) Take 1 tablet by mouth daily.    . calcium carbonate 200 MG capsule Take 1,600 mg by mouth daily.     . Glucosamine-Chondroitin 750-600 MG TABS Take 1 tablet by mouth daily.    Marland Kitchen loratadine (CLARITIN) 10 MG tablet Take 10 mg by mouth daily.    . Multiple Vitamin (MULTIVITAMIN) tablet Take 1 tablet by mouth  daily.    . Omega-3 Fatty Acids (OMEGA 3 PO) Take 2 capsules by mouth daily.     Marland Kitchen omeprazole (PRILOSEC) 20 MG capsule Take 20 mg by mouth daily.    . raloxifene (EVISTA) 60 MG tablet Take 60 mg by mouth daily. Prescribed by Dr. Marina Gravel, GYN    . venlafaxine XR (EFFEXOR-XR) 37.5 MG 24 hr capsule TAKE 1 CAPSULE THREE TIMES WEEKLY (NEEDS WEEKLY) 90 capsule 1   No current facility-administered medications on file prior to visit.     BP 122/64 (BP Location: Left Arm, Patient Position: Sitting, Cuff Size: Normal)   Pulse 77   Temp 98.1 F (36.7 C) (Oral)   Ht 5' 5"  (1.651 m)   Wt 159 lb (72.1 kg)   LMP  (LMP Unknown) Comment: 2005  SpO2 99%   BMI 26.46 kg/m     Review of Systems  Constitutional: Negative.   HENT: Negative for congestion, dental problem, hearing loss, rhinorrhea, sinus pressure, sore throat and tinnitus.   Eyes: Negative for pain, discharge and visual disturbance.  Respiratory: Negative for cough and shortness of breath.   Cardiovascular: Negative for chest pain,  palpitations and leg swelling.  Gastrointestinal: Negative for abdominal distention, abdominal pain, blood in stool, constipation, diarrhea, nausea and vomiting.  Genitourinary: Negative for difficulty urinating, dysuria, flank pain, frequency, hematuria, pelvic pain, urgency, vaginal bleeding, vaginal discharge and vaginal pain.  Musculoskeletal: Negative for arthralgias, gait problem and joint swelling.  Skin: Negative for rash.  Neurological: Negative for dizziness, syncope, speech difficulty, weakness, numbness and headaches.  Hematological: Negative for adenopathy.  Psychiatric/Behavioral: Negative for agitation, behavioral problems and dysphoric mood. The patient is not nervous/anxious.        Objective:   Physical Exam  Constitutional: She is oriented to person, place, and time. She appears well-developed and well-nourished.  HENT:  Head: Normocephalic and atraumatic.  Right Ear: External  ear normal.  Left Ear: External ear normal.  Mouth/Throat: Oropharynx is clear and moist.  Eyes: Conjunctivae and EOM are normal.  Neck: Normal range of motion. Neck supple. No JVD present. No thyromegaly present.  Cardiovascular: Normal rate, regular rhythm, normal heart sounds and intact distal pulses.   No murmur heard. Dorsalis pedis pulses faint.  Posterior tibial pulses full   Pulmonary/Chest: Effort normal and breath sounds normal. She has no wheezes. She has no rales.  Abdominal: Soft. Bowel sounds are normal. She exhibits no distension and no mass. There is no tenderness. There is no rebound and no guarding.  Musculoskeletal: Normal range of motion. She exhibits no edema or tenderness.  Neurological: She is alert and oriented to person, place, and time. She has normal reflexes. No cranial nerve deficit. She exhibits normal muscle tone. Coordination normal.  Skin: Skin is warm and dry. No rash noted.  Psychiatric: She has a normal mood and affect. Her behavior is normal.          Assessment & Plan:   Preventive health examination History of ulcerative colitis.  Follow-up colonoscopy 2 years History of osteoporosis.  Continue Evista  Follow-up OB/GYN Check screening lab  Follow-up here one year or as needed  Nyoka Cowden

## 2017-03-30 NOTE — Addendum Note (Signed)
Addended by: Abelardo Diesel on: 03/30/2017 11:41 AM   Modules accepted: Orders

## 2017-03-30 NOTE — Progress Notes (Signed)
Pre visit review using our clinic review tool, if applicable. No additional management support is needed unless otherwise documented below in the visit note. 

## 2017-04-25 ENCOUNTER — Telehealth: Payer: Self-pay | Admitting: Internal Medicine

## 2017-04-25 ENCOUNTER — Encounter: Payer: Medicare Other | Admitting: Internal Medicine

## 2017-04-25 NOTE — Telephone Encounter (Signed)
° °  Pt said she had labs done back in April and never received her results

## 2017-04-30 NOTE — Telephone Encounter (Signed)
Please call/notify patient that lab/test/procedure is normal;  Cholesterol is 230, but with an elevated good cholesterol- so, lipid profile is low risk

## 2017-04-30 NOTE — Telephone Encounter (Signed)
Dr. Raliegh Ip - Please advise on results. Thanks!

## 2017-05-01 NOTE — Telephone Encounter (Signed)
LMTCB

## 2017-05-03 NOTE — Telephone Encounter (Signed)
LMTCB

## 2017-05-03 NOTE — Telephone Encounter (Signed)
Spoke with pt and advised of lab results. Nothing further needed at this time.

## 2017-05-07 DIAGNOSIS — Z6827 Body mass index (BMI) 27.0-27.9, adult: Secondary | ICD-10-CM | POA: Diagnosis not present

## 2017-05-07 DIAGNOSIS — Z01419 Encounter for gynecological examination (general) (routine) without abnormal findings: Secondary | ICD-10-CM | POA: Diagnosis not present

## 2017-06-07 DIAGNOSIS — L814 Other melanin hyperpigmentation: Secondary | ICD-10-CM | POA: Diagnosis not present

## 2017-06-07 DIAGNOSIS — L821 Other seborrheic keratosis: Secondary | ICD-10-CM | POA: Diagnosis not present

## 2017-06-07 DIAGNOSIS — D485 Neoplasm of uncertain behavior of skin: Secondary | ICD-10-CM | POA: Diagnosis not present

## 2017-06-07 DIAGNOSIS — L820 Inflamed seborrheic keratosis: Secondary | ICD-10-CM | POA: Diagnosis not present

## 2017-06-07 DIAGNOSIS — D225 Melanocytic nevi of trunk: Secondary | ICD-10-CM | POA: Diagnosis not present

## 2017-06-07 DIAGNOSIS — D1801 Hemangioma of skin and subcutaneous tissue: Secondary | ICD-10-CM | POA: Diagnosis not present

## 2017-06-19 DIAGNOSIS — L905 Scar conditions and fibrosis of skin: Secondary | ICD-10-CM | POA: Diagnosis not present

## 2017-06-19 DIAGNOSIS — D485 Neoplasm of uncertain behavior of skin: Secondary | ICD-10-CM | POA: Diagnosis not present

## 2017-09-03 DIAGNOSIS — H04123 Dry eye syndrome of bilateral lacrimal glands: Secondary | ICD-10-CM | POA: Diagnosis not present

## 2017-10-09 DIAGNOSIS — Z23 Encounter for immunization: Secondary | ICD-10-CM | POA: Diagnosis not present

## 2017-10-29 DIAGNOSIS — L820 Inflamed seborrheic keratosis: Secondary | ICD-10-CM | POA: Diagnosis not present

## 2017-10-29 DIAGNOSIS — D229 Melanocytic nevi, unspecified: Secondary | ICD-10-CM | POA: Diagnosis not present

## 2018-01-08 ENCOUNTER — Telehealth: Payer: Self-pay | Admitting: Internal Medicine

## 2018-01-08 NOTE — Telephone Encounter (Unsigned)
Copied from [place] #[id number]. Topic: Medicare AWV >> Jan 08, 2018  3:03 PM Neva Seat wrote: Pt requesting an AWV appt

## 2018-01-11 ENCOUNTER — Other Ambulatory Visit: Payer: Self-pay | Admitting: Obstetrics and Gynecology

## 2018-01-11 DIAGNOSIS — Z1231 Encounter for screening mammogram for malignant neoplasm of breast: Secondary | ICD-10-CM

## 2018-01-28 NOTE — Progress Notes (Addendum)
Subjective:   Whitney Beasley is a 72 y.o. female who presents for Medicare Annual (Subsequent) preventive examination.  Reports health as Seen DR. Raliegh Ip after 03/30/2017   Psychosocial  Son back in [place] w grandson 41 yo  Diet Chol / hdl 3; hdl 76;  BMI 25.6 Breakfast; eat fruit, orange, avocado with onions etc More sausage biscuits  Lunch; eats breakfast late Dinner; you guys cook Chicken etc , does not fry  Salads and green vegetables    Exercise In [place] she did piliates  Walking around the property  Walks x 10 minutes Will try to increase her activity   Hep C-  Do this at your next blood draw at [facility] for April   Mammogram will schedule this year - goes to the breast center now March 11th - has 3 D mammogram  Dexa due via GYN May 22nd  Dr. Julien Girt, [facility] follows mammogram and dexa Currently in [place]  Colonoscopy June 2015; Dr. Raliegh Ip moved to 5 year intervals and this would be due 05/2019 was patient reported  Per Dr Raliegh Ip note in 03/2017; fup colonoscopy in 2 years, will change metric Grandfather had colon cancer  Father ? Copd  And ? Mesothelioma   Flu vaccine  Tdap due but will take at the pharmacy    ETOH  2 glasses of wine per day  Discussed recommended 1 drink per day  Tobacco - never smoked       Objective:     Vitals: BP 140/80   Ht 5' 5.5" (1.664 m)   Wt 164 lb 1 oz (74.4 kg)   LMP  (LMP Unknown) Comment: 2005  SpO2 (!) 60%   BMI 26.89 kg/m   Body mass index is 26.89 kg/m.  Advanced Directives 01/29/2018  Does Patient Have a Medical Advance Directive? Yes    Tobacco Social History   Tobacco Use  Smoking Status Never Smoker  Smokeless Tobacco Never Used     Counseling given: Yes   Clinical Intake:   Past Medical History:  Diagnosis Date  . Allergy   . Asthma   . Depression   . GERD (gastroesophageal reflux disease)   . Ulcerative colitis ([place])   . UTI (urinary tract infection)    Family History  Problem Relation Age of  Onset  . Diabetes Mother        had MI   . COPD Father        smoker, but stopped in teh 13  . Colon cancer Paternal Grandfather    Social History   Socioeconomic History  . Marital status: Married    Spouse name: Not on file  . Number of children: Not on file  . Years of education: Not on file  . Highest education level: Not on file  Social Needs  . Financial resource strain: Not on file  . Food insecurity - worry: Not on file  . Food insecurity - inability: Not on file  . Transportation needs - medical: Not on file  . Transportation needs - non-medical: Not on file  Occupational History  . Not on file  Tobacco Use  . Smoking status: Never Smoker  . Smokeless tobacco: Never Used  Substance and Sexual Activity  . Alcohol use: Yes    Alcohol/week: 8.4 oz    Types: 14 Glasses of wine per week    Comment: 2 glasses of wine per day;   . Drug use: No  . Sexual activity: Yes  Partners: Male  Other Topics Concern  . Not on file  Social History Narrative  . Not on file    Outpatient Encounter Medications as of 01/29/2018  Medication Sig  . B Complex-C (SUPER B COMPLEX PO) Take 1 tablet by mouth daily.  . calcium carbonate 200 MG capsule Take 1,600 mg by mouth daily.   . cephALEXin (KEFLEX) 500 MG capsule Take 500 mg by mouth 4 (four) times daily.  . clindamycin (CLINDAGEL) 1 % gel Apply topically 2 (two) times daily.  . Glucosamine-Chondroitin 750-600 MG TABS Take 1 tablet by mouth daily.  . Lactobacillus (ACIDOPHILUS) 0.5 MG TABS Take by mouth.  . loratadine (CLARITIN) 10 MG tablet Take 10 mg by mouth daily.  . Multiple Vitamin (MULTIVITAMIN) tablet Take 1 tablet by mouth daily.  . Omega-3 Fatty Acids (OMEGA 3 PO) Take 2 capsules by mouth daily.   Marland Kitchen omeprazole (PRILOSEC) 20 MG capsule Take 20 mg by mouth daily.  . raloxifene (EVISTA) 60 MG tablet Take 60 mg by mouth daily. Prescribed by Dr. Marina Gravel, GYN  . venlafaxine XR (EFFEXOR-XR) 37.5 MG 24 hr capsule TAKE 1  CAPSULE THREE TIMES WEEKLY (NEEDS WEEKLY)  . zinc gluconate 50 MG tablet Take 50 mg by mouth daily.  Marland Kitchen tretinoin (RETIN-A) 0.01 % gel Apply topically at bedtime.   No facility-administered encounter medications on file as of 01/29/2018.     Activities of Daily Living In your present state of health, do you have any difficulty performing the following activities: 01/29/2018  Hearing? N  Vision? N  Difficulty concentrating or making decisions? N  Comment no issues   Walking or climbing stairs? N  Dressing or bathing? N  Doing errands, shopping? N  Preparing Food and eating ? N  Using the Toilet? N  In the past six months, have you accidently leaked urine? N  Do you have problems with loss of bowel control? N  Managing your Medications? N  Managing your Finances? N  Housekeeping or managing your Housekeeping? N  Some recent data might be hidden    Patient Care Team: Marletta Lor, MD as PCP - General (Internal Medicine)    Assessment:   This is a routine wellness examination for Whitney Beasley.  Exercise Activities and Dietary recommendations Current Exercise Habits: Home exercise routine, Type of exercise: walking;strength training/weights, Time (Minutes): 30(of some type of exercise), Frequency (Times/Week): 4, Weekly Exercise (Minutes/Week): 120, Intensity: Moderate  Goals    . Patient Stated     Take time to design your exercise program        Fall Risk Fall Risk  01/29/2018 03/30/2017 11/23/2016 12/31/2014  Falls in the past year? No No No Yes  Number falls in past yr: - - - 1  Injury with Fall? - - - No  Risk for fall due to : - - - Impaired balance/gait     Depression Screen PHQ 2/9 Scores 01/29/2018 03/30/2017 11/23/2016 12/31/2014  PHQ - 2 Score 0 1 0 0     Cognitive Function MMSE - Mini Mental State Exam 11/23/2016  Not completed: (No Data)     no issues with daily life    Immunization History  Administered Date(s) Administered  . Influenza, High Dose  Seasonal PF 10/08/2014, 10/02/2016  . Influenza-Unspecified 09/17/2017  . Pneumococcal Conjugate-13 05/07/2014  . Pneumococcal Polysaccharide-23 03/30/2017  . Td 12/23/2007  . Zoster 12/02/2009    Educated regarding shingrix   Screening Tests Health Maintenance  Topic Date Due  .  Hepatitis C Screening  04/16/2018 (Originally [birth year])  . DEXA SCAN  05/08/2018 (Originally 06/04/2013)  . TETANUS/TDAP  01/29/2019 (Originally 12/22/2017)  . COLONOSCOPY  05/19/2019 (Originally 05/29/2017)  . MAMMOGRAM  02/21/2018  . INFLUENZA VACCINE  Completed  . PNA vac Low Risk Adult  Completed         Plan:      PCP Notes   Health Maintenance Mammogram scheduled for March 11 Dexa - scheduled for May 22nd  Dr. Julien Girt, [facility] follows mammogram and dexa Currently in [place] for osteopenia  Colonoscopy June 2018;  Dr. Raliegh Ip moved to 5 year intervals and this would be due 05/2019 per the  patient reported  Per Dr Raliegh Ip note in 03/2017; fup colonoscopy in 2 years, will change metric in epic   Educated regarding shingrix    Abnormal Screens  Drinks 2 glasses of wine every day Recommended one daily is the guideline  Referrals  None   Patient concerns; States she is not taking Effexor -XR 3 times a week and is doing well   Nurse Concerns; As noted   Next PCP [facility] for after 03/30/2018 for general fup and blood work      I have personally reviewed and noted the following in the patient's chart:   . Medical and social history . Use of alcohol, tobacco or illicit drugs  . Current medications and supplements . Functional ability and status . Nutritional status . Physical activity . Advanced directives . List of other physicians . Hospitalizations, surgeries, and ER visits in previous 12 months . Vitals . Screenings to include cognitive, depression, and falls . Referrals and appointments  In addition, I have reviewed and discussed with patient certain preventive protocols, quality  metrics, and best practice recommendations. A written personalized care plan for preventive services as well as general preventive health recommendations were provided to patient.     OHFGB,MSXJD, RN  01/29/2018  Results of the subsequent Medicare wellness visit reviewed and agree with findings.  Nyoka Cowden

## 2018-01-29 ENCOUNTER — Ambulatory Visit: Payer: Medicare Other

## 2018-01-29 ENCOUNTER — Ambulatory Visit (INDEPENDENT_AMBULATORY_CARE_PROVIDER_SITE_OTHER): Payer: Medicare Other

## 2018-01-29 VITALS — BP 140/80 | Ht 65.5 in | Wt 164.1 lb

## 2018-01-29 DIAGNOSIS — Z0000 Encounter for general adult medical examination without abnormal findings: Secondary | ICD-10-CM | POA: Diagnosis not present

## 2018-01-29 DIAGNOSIS — Z1159 Encounter for screening for other viral diseases: Secondary | ICD-10-CM | POA: Diagnosis not present

## 2018-01-29 NOTE — Patient Instructions (Addendum)
Whitney Beasley , Thank you for taking time to come for your Medicare Wellness Visit. I appreciate your ongoing commitment to your health goals. Please review the following plan we discussed and let me know if I can assist you in the future.   Please make an apt Dr Raliegh Ip after 4/13 for blood work and general fup   Medicare now request all "baby boomers" test for possible exposure to Hepatitis C. Many may have been exposed due to dental work, tatoo's, vaccinations when young. The Hepatitis C virus is dormant for many years and then sometimes will cause liver cancer. If you gave blood in the past 15 years, you were most likely checked for Hep C. If you rec'd blood; you may want to consider testing or if you are high risk for any other reason.   Has scheduled mammogram this year;   Shingrix is a vaccine for the prevention of Shingles in Adults 50 and older.  If you are on Medicare, you can request a prescription from your doctor to be filled at a pharmacy.  Please check with your benefits regarding applicable copays or out of pocket expenses.  The Shingrix is given in 2 vaccines approx 8 weeks apart. You must receive the 2nd dose prior to 6 months from receipt of the first.   A Tetanus is recommended every 10 years. Medicare covers a tetanus if you have a cut or wound; otherwise, there may be a charge. If you had not had a tetanus with pertusses, known as the Tdap, you can take this anytime.  Had regular TD in 2009  Now your tetanus is due but you will get Tdap    These are the goals we discussed: Goals    . Patient Stated     Take time to design your exercise program        This is a list of the screening recommended for you and due dates:  Health Maintenance  Topic Date Due  .  Hepatitis C: One time screening is recommended by Center for Disease Control  (CDC) for  adults born from [birth year] through 1963.   1945/12/29  . DEXA scan (bone density measurement)  06/04/2013  . Colon Cancer  Screening  05/29/2017  . Tetanus Vaccine  12/22/2017  . Mammogram  02/21/2018  . Flu Shot  Completed  . Pneumonia vaccines  Completed      Fall Prevention in the Home Falls can cause injuries. They can happen to people of all ages. There are many things you can do to make your home safe and to help prevent falls. What can I do on the outside of my home?  Regularly fix the edges of walkways and driveways and fix any cracks.  Remove anything that might make you trip as you walk through a door, such as a raised step or threshold.  Trim any bushes or trees on the path to your home.  Use bright outdoor lighting.  Clear any walking paths of anything that might make someone trip, such as rocks or tools.  Regularly check to see if handrails are loose or broken. Make sure that both sides of any steps have handrails.  Any raised decks and porches should have guardrails on the edges.  Have any leaves, snow, or ice cleared regularly.  Use sand or salt on walking paths during winter.  Clean up any spills in your garage right away. This includes oil or grease spills. What can I do in the bathroom?  Use night lights.  Install grab bars by the toilet and in the tub and shower. Do not use towel bars as grab bars.  Use non-skid mats or decals in the tub or shower.  If you need to sit down in the shower, use a plastic, non-slip stool.  Keep the floor dry. Clean up any water that spills on the floor as soon as it happens.  Remove soap buildup in the tub or shower regularly.  Attach bath mats securely with double-sided non-slip rug tape.  Do not have throw rugs and other things on the floor that can make you trip. What can I do in the bedroom?  Use night lights.  Make sure that you have a light by your bed that is easy to reach.  Do not use any sheets or blankets that are too big for your bed. They should not hang down onto the floor.  Have a firm chair that has side arms. You can  use this for support while you get dressed.  Do not have throw rugs and other things on the floor that can make you trip. What can I do in the kitchen?  Clean up any spills right away.  Avoid walking on wet floors.  Keep items that you use a lot in easy-to-reach places.  If you need to reach something above you, use a strong step stool that has a grab bar.  Keep electrical cords out of the way.  Do not use floor polish or wax that makes floors slippery. If you must use wax, use non-skid floor wax.  Do not have throw rugs and other things on the floor that can make you trip. What can I do with my stairs?  Do not leave any items on the stairs.  Make sure that there are handrails on both sides of the stairs and use them. Fix handrails that are broken or loose. Make sure that handrails are as long as the stairways.  Check any carpeting to make sure that it is firmly attached to the stairs. Fix any carpet that is loose or worn.  Avoid having throw rugs at the top or bottom of the stairs. If you do have throw rugs, attach them to the floor with carpet tape.  Make sure that you have a light switch at the top of the stairs and the bottom of the stairs. If you do not have them, ask someone to add them for you. What else can I do to help prevent falls?  Wear shoes that: ? Do not have high heels. ? Have rubber bottoms. ? Are comfortable and fit you well. ? Are closed at the toe. Do not wear sandals.  If you use a stepladder: ? Make sure that it is fully opened. Do not climb a closed stepladder. ? Make sure that both sides of the stepladder are locked into place. ? Ask someone to hold it for you, if possible.  Clearly mark and make sure that you can see: ? Any grab bars or handrails. ? First and last steps. ? Where the edge of each step is.  Use tools that help you move around (mobility aids) if they are needed. These include: ? Canes. ? Walkers. ? Scooters. ? Crutches.  Turn  on the lights when you go into a dark area. Replace any light bulbs as soon as they burn out.  Set up your furniture so you have a clear path. Avoid moving your furniture around.  If any  of your floors are uneven, fix them.  If there are any pets around you, be aware of where they are.  Review your medicines with your doctor. Some medicines can make you feel dizzy. This can increase your chance of falling. Ask your doctor what other things that you can do to help prevent falls. This information is not intended to replace advice given to you by your health care provider. Make sure you discuss any questions you have with your health care provider. Document Released: 09/30/2009 Document Revised: 05/11/2016 Document Reviewed: 01/08/2015 Elsevier Interactive Patient Education  2018 [place] Maintenance, Female Adopting a healthy lifestyle and getting preventive care can go a long way to promote health and wellness. Talk with your health care provider about what schedule of regular examinations is right for you. This is a good chance for you to check in with your provider about disease prevention and staying healthy. In between checkups, there are plenty of things you can do on your own. Experts have done a lot of research about which lifestyle changes and preventive measures are most likely to keep you healthy. Ask your health care provider for more information. Weight and diet Eat a healthy diet  Be sure to include plenty of vegetables, fruits, low-fat dairy products, and lean protein.  Do not eat a lot of foods high in solid fats, added sugars, or salt.  Get regular exercise. This is one of the most important things you can do for your health. ? Most adults should exercise for at least 150 minutes each week. The exercise should increase your heart rate and make you sweat (moderate-intensity exercise). ? Most adults should also do strengthening exercises at least twice a week.  This is in addition to the moderate-intensity exercise.  Maintain a healthy weight  Body mass index (BMI) is a measurement that can be used to identify possible weight problems. It estimates body fat based on height and weight. Your health care provider can help determine your BMI and help you achieve or maintain a healthy weight.  For females 38 years of age and older: ? A BMI below 18.5 is considered underweight. ? A BMI of 18.5 to 24.9 is normal. ? A BMI of 25 to 29.9 is considered overweight. ? A BMI of 30 and above is considered obese.  Watch levels of cholesterol and blood lipids  You should start having your blood tested for lipids and cholesterol at 70 years of age, then have this test every 5 years.  You may need to have your cholesterol levels checked more often if: ? Your lipid or cholesterol levels are high. ? You are older than 70 years of age. ? You are at high risk for heart disease.  Cancer screening Lung Cancer  Lung cancer screening is recommended for adults 65-58 years old who are at high risk for lung cancer because of a history of smoking.  A yearly low-dose CT scan of the lungs is recommended for people who: ? Currently smoke. ? Have quit within the past 15 years. ? Have at least a 30-pack-year history of smoking. A pack year is smoking an average of one pack of cigarettes a day for 1 year.  Yearly screening should continue until it has been 15 years since you quit.  Yearly screening should stop if you develop a health problem that would prevent you from having lung cancer treatment.  Breast Cancer  Practice breast self-awareness. This means understanding how your breasts normally  appear and feel.  It also means doing regular breast self-exams. Let your health care provider know about any changes, no matter how small.  If you are in your 20s or 30s, you should have a clinical breast exam (CBE) by a health care provider every 1-3 years as part of a  regular health exam.  If you are 41 or older, have a CBE every year. Also consider having a breast X-ray (mammogram) every year.  If you have a family history of breast cancer, talk to your health care provider about genetic screening.  If you are at high risk for breast cancer, talk to your health care provider about having an MRI and a mammogram every year.  Breast cancer gene (BRCA) assessment is recommended for women who have family members with BRCA-related cancers. BRCA-related cancers include: ? Breast. ? Ovarian. ? Tubal. ? Peritoneal cancers.  Results of the assessment will determine the need for genetic counseling and BRCA1 and BRCA2 testing.  Cervical Cancer Your health care provider may recommend that you be screened regularly for cancer of the pelvic organs (ovaries, uterus, and vagina). This screening involves a pelvic examination, including checking for microscopic changes to the surface of your cervix (Pap test). You may be encouraged to have this screening done every 3 years, beginning at age 72.  For women ages 25-65, health care providers may recommend pelvic exams and Pap testing every 3 years, or they may recommend the Pap and pelvic exam, combined with testing for human papilloma virus (HPV), every 5 years. Some types of HPV increase your risk of cervical cancer. Testing for HPV may also be done on women of any age with unclear Pap test results.  Other health care providers may not recommend any screening for nonpregnant women who are considered low risk for pelvic cancer and who do not have symptoms. Ask your health care provider if a screening pelvic exam is right for you.  If you have had past treatment for cervical cancer or a condition that could lead to cancer, you need Pap tests and screening for cancer for at least 20 years after your treatment. If Pap tests have been discontinued, your risk factors (such as having a new sexual partner) need to be reassessed to  determine if screening should resume. Some women have medical problems that increase the chance of getting cervical cancer. In these cases, your health care provider may recommend more frequent screening and Pap tests.  Colorectal Cancer  This type of cancer can be detected and often prevented.  Routine colorectal cancer screening usually begins at 70 years of age and continues through 70 years of age.  Your health care provider may recommend screening at an earlier age if you have risk factors for colon cancer.  Your health care provider may also recommend using home test kits to check for hidden blood in the stool.  A small camera at the end of a tube can be used to examine your colon directly (sigmoidoscopy or colonoscopy). This is done to check for the earliest forms of colorectal cancer.  Routine screening usually begins at age 34.  Direct examination of the colon should be repeated every 5-10 years through 70 years of age. However, you may need to be screened more often if early forms of precancerous polyps or small growths are found.  Skin Cancer  Check your skin from head to toe regularly.  Tell your health care provider about any new moles or changes in moles, especially  if there is a change in a mole's shape or color.  Also tell your health care provider if you have a mole that is larger than the size of a pencil eraser.  Always use sunscreen. Apply sunscreen liberally and repeatedly throughout the day.  Protect yourself by wearing long sleeves, pants, a wide-brimmed hat, and sunglasses whenever you are outside.  Heart disease, diabetes, and high blood pressure  High blood pressure causes heart disease and increases the risk of stroke. High blood pressure is more likely to develop in: ? People who have blood pressure in the high end of the normal range (130-139/85-89 mm Hg). ? People who are overweight or obese. ? People who are African American.  If you are 18-39 years  of age, have your blood pressure checked every 3-5 years. If you are 39 years of age or older, have your blood pressure checked every year. You should have your blood pressure measured twice-once when you are at a hospital or clinic, and once when you are not at a hospital or clinic. Record the average of the two measurements. To check your blood pressure when you are not at a hospital or clinic, you can use: ? An automated blood pressure machine at a pharmacy. ? A home blood pressure monitor.  If you are between 34 years and 37 years old, ask your health care provider if you should take aspirin to prevent strokes.  Have regular diabetes screenings. This involves taking a blood sample to check your fasting blood sugar level. ? If you are at a normal weight and have a low risk for diabetes, have this test once every three years after 70 years of age. ? If you are overweight and have a high risk for diabetes, consider being tested at a younger age or more often. Preventing infection Hepatitis B  If you have a higher risk for hepatitis B, you should be screened for this virus. You are considered at high risk for hepatitis B if: ? You were born in a country where hepatitis B is common. Ask your health care provider which countries are considered high risk. ? Your parents were born in a high-risk country, and you have not been immunized against hepatitis B (hepatitis B vaccine). ? You have HIV or AIDS. ? You use needles to inject street drugs. ? You live with someone who has hepatitis B. ? You have had sex with someone who has hepatitis B. ? You get hemodialysis treatment. ? You take certain medicines for conditions, including cancer, organ transplantation, and autoimmune conditions.  Hepatitis C  Blood testing is recommended for: ? Everyone born from [birth year] through 1963. ? Anyone with known risk factors for hepatitis C.  Sexually transmitted infections (STIs)  You should be screened for  sexually transmitted infections (STIs) including gonorrhea and chlamydia if: ? You are sexually active and are younger than 70 years of age. ? You are older than 70 years of age and your health care provider tells you that you are at risk for this type of infection. ? Your sexual activity has changed since you were last screened and you are at an increased risk for chlamydia or gonorrhea. Ask your health care provider if you are at risk.  If you do not have HIV, but are at risk, it may be recommended that you take a prescription medicine daily to prevent HIV infection. This is called pre-exposure prophylaxis (PrEP). You are considered at risk if: ? You are sexually active  and do not regularly use condoms or know the HIV status of your partner(s). ? You take drugs by injection. ? You are sexually active with a partner who has HIV.  Talk with your health care provider about whether you are at high risk of being infected with HIV. If you choose to begin PrEP, you should first be tested for HIV. You should then be tested every 3 months for as long as you are taking PrEP. Pregnancy  If you are premenopausal and you may become pregnant, ask your health care provider about preconception counseling.  If you may become pregnant, take 400 to 800 micrograms (mcg) of folic acid every day.  If you want to prevent pregnancy, talk to your health care provider about birth control (contraception). Osteoporosis and menopause  Osteoporosis is a disease in which the bones lose minerals and strength with aging. This can result in serious bone fractures. Your risk for osteoporosis can be identified using a bone density scan.  If you are 79 years of age or older, or if you are at risk for osteoporosis and fractures, ask your health care provider if you should be screened.  Ask your health care provider whether you should take a calcium or vitamin D supplement to lower your risk for osteoporosis.  Menopause may  have certain physical symptoms and risks.  Hormone replacement therapy may reduce some of these symptoms and risks. Talk to your health care provider about whether hormone replacement therapy is right for you. Follow these instructions at home:  Schedule regular health, dental, and eye exams.  Stay current with your immunizations.  Do not use any tobacco products including cigarettes, chewing tobacco, or electronic cigarettes.  If you are pregnant, do not drink alcohol.  If you are breastfeeding, limit how much and how often you drink alcohol.  Limit alcohol intake to no more than 1 drink per day for nonpregnant women. One drink equals 12 ounces of beer, 5 ounces of wine, or 1 ounces of hard liquor.  Do not use street drugs.  Do not share needles.  Ask your health care provider for help if you need support or information about quitting drugs.  Tell your health care provider if you often feel depressed.  Tell your health care provider if you have ever been abused or do not feel safe at home. This information is not intended to replace advice given to you by your health care provider. Make sure you discuss any questions you have with your health care provider. Document Released: 06/19/2011 Document Revised: 05/11/2016 Document Reviewed: 09/07/2015 Elsevier Interactive Patient Education  [employer].

## 2018-01-31 DIAGNOSIS — L718 Other rosacea: Secondary | ICD-10-CM | POA: Diagnosis not present

## 2018-01-31 DIAGNOSIS — L570 Actinic keratosis: Secondary | ICD-10-CM | POA: Diagnosis not present

## 2018-01-31 DIAGNOSIS — L819 Disorder of pigmentation, unspecified: Secondary | ICD-10-CM | POA: Diagnosis not present

## 2018-01-31 DIAGNOSIS — D225 Melanocytic nevi of trunk: Secondary | ICD-10-CM | POA: Diagnosis not present

## 2018-02-15 ENCOUNTER — Other Ambulatory Visit: Payer: Self-pay | Admitting: Internal Medicine

## 2018-02-25 ENCOUNTER — Ambulatory Visit
Admission: RE | Admit: 2018-02-25 | Discharge: 2018-02-25 | Disposition: A | Discharge status: Home / Self Care | Payer: Medicare Other | Source: Ambulatory Visit | Attending: Obstetrics and Gynecology | Admitting: Obstetrics and Gynecology

## 2018-02-25 DIAGNOSIS — Z1231 Encounter for screening mammogram for malignant neoplasm of breast: Secondary | ICD-10-CM

## 2018-04-15 ENCOUNTER — Encounter: Payer: Self-pay | Admitting: Internal Medicine

## 2018-04-15 ENCOUNTER — Ambulatory Visit (INDEPENDENT_AMBULATORY_CARE_PROVIDER_SITE_OTHER): Payer: Medicare Other | Admitting: Internal Medicine

## 2018-04-15 VITALS — BP 110/78 | HR 79 | Temp 97.9°F | Ht 65.5 in | Wt 165.0 lb

## 2018-04-15 DIAGNOSIS — E785 Hyperlipidemia, unspecified: Secondary | ICD-10-CM

## 2018-04-15 DIAGNOSIS — F4323 Adjustment disorder with mixed anxiety and depressed mood: Secondary | ICD-10-CM

## 2018-04-15 DIAGNOSIS — M818 Other osteoporosis without current pathological fracture: Secondary | ICD-10-CM

## 2018-04-15 DIAGNOSIS — K5100 Ulcerative (chronic) pancolitis without complications: Secondary | ICD-10-CM | POA: Diagnosis not present

## 2018-04-15 LAB — LIPID PANEL
CHOL/HDL RATIO: 4
CHOLESTEROL: 225 mg/dL — AB (ref 0–200)
HDL: 59 mg/dL (ref >39.00)
NonHDL: 165.53
Triglycerides: 214 mg/dL — ABNORMAL HIGH (ref 0.0–149.0)
VLDL: 42.8 mg/dL — AB (ref 0.0–40.0)

## 2018-04-15 LAB — COMPREHENSIVE METABOLIC PANEL
ALBUMIN: 4.2 g/dL (ref 3.5–5.2)
ALT: 20 U/L (ref 0–35)
AST: 20 U/L (ref 0–37)
Alkaline Phosphatase: 63 U/L (ref 39–117)
BILIRUBIN TOTAL: 0.6 mg/dL (ref 0.2–1.2)
BUN: 18 mg/dL (ref 6–23)
CALCIUM: 9.4 mg/dL (ref 8.4–10.5)
CHLORIDE: 107 meq/L (ref 96–112)
CO2: 26 meq/L (ref 19–32)
CREATININE: 0.77 mg/dL (ref 0.40–1.20)
GFR: 78.8 mL/min (ref >60.00)
Glucose, Bld: 87 mg/dL (ref 70–99)
Potassium: 4 mEq/L (ref 3.5–5.1)
SODIUM: 143 meq/L (ref 135–145)
Total Protein: 6.5 g/dL (ref 6.0–8.3)

## 2018-04-15 LAB — LDL CHOLESTEROL, DIRECT: LDL DIRECT: 137 mg/dL

## 2018-04-15 LAB — CBC WITH DIFFERENTIAL/PLATELET
BASOS ABS: 0.1 10*3/uL (ref 0.0–0.1)
BASOS PCT: 1.8 % (ref 0.0–3.0)
EOS ABS: 0.2 10*3/uL (ref 0.0–0.7)
Eosinophils Relative: 4 % (ref 0.0–5.0)
HCT: 42.2 % (ref 36.0–46.0)
Hemoglobin: 14.3 g/dL (ref 12.0–15.0)
LYMPHS PCT: 40.9 % (ref 12.0–46.0)
Lymphs Abs: 2.2 10*3/uL (ref 0.7–4.0)
MCHC: 33.8 g/dL (ref 30.0–36.0)
MCV: 94.9 fl (ref 78.0–100.0)
MONO ABS: 0.5 10*3/uL (ref 0.1–1.0)
Monocytes Relative: 9.2 % (ref 3.0–12.0)
NEUTROS ABS: 2.3 10*3/uL (ref 1.4–7.7)
NEUTROS PCT: 44.1 % (ref 43.0–77.0)
PLATELETS: 216 10*3/uL (ref 150.0–400.0)
RBC: 4.45 Mil/uL (ref 3.87–5.11)
RDW: 13.4 % (ref 11.5–15.5)
WBC: 5.3 10*3/uL (ref 4.0–10.5)

## 2018-04-15 LAB — TSH: TSH: 3.01 u[IU]/mL (ref 0.35–4.50)

## 2018-04-15 NOTE — Progress Notes (Signed)
Subjective:    Patient ID: Whitney Beasley, female    DOB: 03/12/1946, 72 y.o.   MRN: [id number]  HPI  72 year old patient who enjoys excellent health.  She is seen today for her annual follow-up.  She has had a recent subsequent Medicare wellness visit.  She has a history of prior adjustment disorder with mixed anxiety and depressed mood and has done quite well on Effexor 3 times weekly.  She is followed annually by gynecology and is scheduled for a follow-up visit next month.  She has had a recent mammogram. Colonoscopy 2015. She is also followed by dermatology  Past Medical History:  Diagnosis Date  . Allergy   . Asthma   . Depression   . GERD (gastroesophageal reflux disease)   . Ulcerative colitis ([place])   . UTI (urinary tract infection)      Social History   Socioeconomic History  . Marital status: Married    Spouse name: Not on file  . Number of children: Not on file  . Years of education: Not on file  . Highest education level: Not on file  Occupational History  . Not on file  Social Needs  . Financial resource strain: Not on file  . Food insecurity:    Worry: Not on file    Inability: Not on file  . Transportation needs:    Medical: Not on file    Non-medical: Not on file  Tobacco Use  . Smoking status: Never Smoker  . Smokeless tobacco: Never Used  Substance and Sexual Activity  . Alcohol use: Yes    Alcohol/week: 8.4 oz    Types: 14 Glasses of wine per week    Comment: 2 glasses of wine per day;   . Drug use: No  . Sexual activity: Yes    Partners: Male  Lifestyle  . Physical activity:    Days per week: Not on file    Minutes per session: Not on file  . Stress: Not on file  Relationships  . Social connections:    Talks on phone: Not on file    Gets together: Not on file    Attends religious service: Not on file    Active member of club or organization: Not on file    Attends meetings of clubs or organizations: Not on file    Relationship status:  Not on file  . Intimate partner violence:    Fear of current or ex partner: Not on file    Emotionally abused: Not on file    Physically abused: Not on file    Forced sexual activity: Not on file  Other Topics Concern  . Not on file  Social History Narrative  . Not on file    Past Surgical History:  Procedure Laterality Date  . TONSILLECTOMY Bilateral 1950's    Family History  Problem Relation Age of Onset  . Diabetes Mother        had MI   . COPD Father        smoker, but stopped in teh 96  . Colon cancer Paternal Grandfather     Allergies  Allergen Reactions  . Minocycline Other (See Comments)    Joint pain and Fatigue    Current Outpatient Medications on File Prior to Visit  Medication Sig Dispense Refill  . B Complex-C (SUPER B COMPLEX PO) Take 1 tablet by mouth daily.    . calcium carbonate 200 MG capsule Take 1,600 mg by mouth daily.     Marland Kitchen  cephALEXin (KEFLEX) 500 MG capsule Take 500 mg by mouth 4 (four) times daily.    . clindamycin (CLINDAGEL) 1 % gel Apply topically 2 (two) times daily.    . Glucosamine-Chondroitin 750-600 MG TABS Take 1 tablet by mouth daily.    . Lactobacillus (ACIDOPHILUS) 0.5 MG TABS Take by mouth.    . loratadine (CLARITIN) 10 MG tablet Take 10 mg by mouth daily.    . Multiple Vitamin (MULTIVITAMIN) tablet Take 1 tablet by mouth daily.    . Omega-3 Fatty Acids (OMEGA 3 PO) Take 2 capsules by mouth daily.     Marland Kitchen omeprazole (PRILOSEC) 20 MG capsule Take 20 mg by mouth daily.    . raloxifene (EVISTA) 60 MG tablet Take 60 mg by mouth daily. Prescribed by Dr. Marina Gravel, GYN    . tretinoin (RETIN-A) 0.01 % gel Apply topically at bedtime.    Marland Kitchen venlafaxine XR (EFFEXOR-XR) 37.5 MG 24 hr capsule Take 1 capsule (37.5 mg total) by mouth 3 (three) times a week. 39 capsule 1  . zinc gluconate 50 MG tablet Take 50 mg by mouth daily.     No current facility-administered medications on file prior to visit.     BP 110/78 (BP Location: Right Arm,  Patient Position: Sitting, Cuff Size: Large)   Pulse 79   Temp 97.9 F (36.6 C) (Oral)   Ht 5' 5.5" (1.664 m)   Wt 165 lb (74.8 kg)   LMP  (LMP Unknown) Comment: 2005  SpO2 98%   BMI 27.04 kg/m     Review of Systems  Constitutional: Negative.   HENT: Negative for congestion, dental problem, hearing loss, rhinorrhea, sinus pressure, sore throat and tinnitus.   Eyes: Negative for pain, discharge and visual disturbance.  Respiratory: Negative for cough and shortness of breath.   Cardiovascular: Negative for chest pain, palpitations and leg swelling.  Gastrointestinal: Negative for abdominal distention, abdominal pain, blood in stool, constipation, diarrhea, nausea and vomiting.  Genitourinary: Negative for difficulty urinating, dysuria, flank pain, frequency, hematuria, pelvic pain, urgency, vaginal bleeding, vaginal discharge and vaginal pain.  Musculoskeletal: Negative for arthralgias, gait problem and joint swelling.  Skin: Negative for rash.  Neurological: Negative for dizziness, syncope, speech difficulty, weakness, numbness and headaches.  Hematological: Negative for adenopathy.  Psychiatric/Behavioral: Negative for agitation, behavioral problems and dysphoric mood. The patient is not nervous/anxious.        Objective:   Physical Exam  Constitutional: She is oriented to person, place, and time. She appears well-developed and well-nourished.  HENT:  Head: Normocephalic.  Right Ear: External ear normal.  Left Ear: External ear normal.  Mouth/Throat: Oropharynx is clear and moist.  Eyes: Pupils are equal, round, and reactive to light. Conjunctivae and EOM are normal.  Neck: Normal range of motion. Neck supple. No thyromegaly present.  Cardiovascular: Normal rate, regular rhythm and normal heart sounds.  Decreased left posterior tibial pulse  Pulmonary/Chest: Effort normal and breath sounds normal.  Abdominal: Soft. Bowel sounds are normal. She exhibits no mass. There is no  tenderness.  Musculoskeletal: Normal range of motion.  Lymphadenopathy:    She has no cervical adenopathy.  Neurological: She is alert and oriented to person, place, and time.  Skin: Skin is warm and dry. No rash noted.  Psychiatric: She has a normal mood and affect. Her behavior is normal.          Assessment & Plan:   Adjustment disorder with mixed anxiety and depressed mood stable History of ulcerative colitis.  Follow-up GI ; suggest follow-up colonoscopy in 1 year.   Osteoporosis.  Continue Evista calcium and vitamin D supplements Allergic rhinitis  Check updated lab Follow-up 1 year or as needed  Nyoka Cowden

## 2018-04-15 NOTE — Patient Instructions (Addendum)
Limit your sodium (Salt) intake    It is important that you exercise regularly, at least 20 minutes 3 to 4 times per week.  If you develop chest pain or shortness of breath seek  medical attention.  Take a calcium supplement, plus [phone] units of vitamin D  Return in one year for follow-up   Health Maintenance for Postmenopausal Women Menopause is a normal process in which your reproductive ability comes to an end. This process happens gradually over a span of months to years, usually between the ages of 46 and 84. Menopause is complete when you have missed 12 consecutive menstrual periods. It is important to talk with your health care provider about some of the most common conditions that affect postmenopausal women, such as heart disease, cancer, and bone loss (osteoporosis). Adopting a healthy lifestyle and getting preventive care can help to promote your health and wellness. Those actions can also lower your chances of developing some of these common conditions. What should I know about menopause? During menopause, you may experience a number of symptoms, such as:  Moderate-to-severe hot flashes.  Night sweats.  Decrease in sex drive.  Mood swings.  Headaches.  Tiredness.  Irritability.  Memory problems.  Insomnia.  Choosing to treat or not to treat menopausal changes is an individual decision that you make with your health care provider. What should I know about hormone replacement therapy and supplements? Hormone therapy products are effective for treating symptoms that are associated with menopause, such as hot flashes and night sweats. Hormone replacement carries certain risks, especially as you become older. If you are thinking about using estrogen or estrogen with progestin treatments, discuss the benefits and risks with your health care provider. What should I know about heart disease and stroke? Heart disease, heart attack, and stroke become more likely as you age.  This may be due, in part, to the hormonal changes that your body experiences during menopause. These can affect how your body processes dietary fats, triglycerides, and cholesterol. Heart attack and stroke are both medical emergencies. There are many things that you can do to help prevent heart disease and stroke:  Have your blood pressure checked at least every 1-2 years. High blood pressure causes heart disease and increases the risk of stroke.  If you are 36-44 years old, ask your health care provider if you should take aspirin to prevent a heart attack or a stroke.  Do not use any tobacco products, including cigarettes, chewing tobacco, or electronic cigarettes. If you need help quitting, ask your health care provider.  It is important to eat a healthy diet and maintain a healthy weight. ? Be sure to include plenty of vegetables, fruits, low-fat dairy products, and lean protein. ? Avoid eating foods that are high in solid fats, added sugars, or salt (sodium).  Get regular exercise. This is one of the most important things that you can do for your health. ? Try to exercise for at least 150 minutes each week. The type of exercise that you do should increase your heart rate and make you sweat. This is known as moderate-intensity exercise. ? Try to do strengthening exercises at least twice each week. Do these in addition to the moderate-intensity exercise.  Know your numbers.Ask your health care provider to check your cholesterol and your blood glucose. Continue to have your blood tested as directed by your health care provider.  What should I know about cancer screening? There are several types of cancer. Take the  following steps to reduce your risk and to catch any cancer development as early as possible. Breast Cancer  Practice breast self-awareness. ? This means understanding how your breasts normally appear and feel. ? It also means doing regular breast self-exams. Let your health care  provider know about any changes, no matter how small.  If you are 83 or older, have a clinician do a breast exam (clinical breast exam or CBE) every year. Depending on your age, family history, and medical history, it may be recommended that you also have a yearly breast X-ray (mammogram).  If you have a family history of breast cancer, talk with your health care provider about genetic screening.  If you are at high risk for breast cancer, talk with your health care provider about having an MRI and a mammogram every year.  Breast cancer (BRCA) gene test is recommended for women who have family members with BRCA-related cancers. Results of the assessment will determine the need for genetic counseling and BRCA1 and for BRCA2 testing. BRCA-related cancers include these types: ? Breast. This occurs in males or females. ? Ovarian. ? Tubal. This may also be called fallopian tube cancer. ? Cancer of the abdominal or pelvic lining (peritoneal cancer). ? Prostate. ? Pancreatic.  Cervical, Uterine, and Ovarian Cancer Your health care provider may recommend that you be screened regularly for cancer of the pelvic organs. These include your ovaries, uterus, and vagina. This screening involves a pelvic exam, which includes checking for microscopic changes to the surface of your cervix (Pap test).  For women ages 21-65, health care providers may recommend a pelvic exam and a Pap test every three years. For women ages 21-65, they may recommend the Pap test and pelvic exam, combined with testing for human papilloma virus (HPV), every five years. Some types of HPV increase your risk of cervical cancer. Testing for HPV may also be done on women of any age who have unclear Pap test results.  Other health care providers may not recommend any screening for nonpregnant women who are considered low risk for pelvic cancer and have no symptoms. Ask your health care provider if a screening pelvic exam is right for  you.  If you have had past treatment for cervical cancer or a condition that could lead to cancer, you need Pap tests and screening for cancer for at least 20 years after your treatment. If Pap tests have been discontinued for you, your risk factors (such as having a new sexual partner) need to be reassessed to determine if you should start having screenings again. Some women have medical problems that increase the chance of getting cervical cancer. In these cases, your health care provider may recommend that you have screening and Pap tests more often.  If you have a family history of uterine cancer or ovarian cancer, talk with your health care provider about genetic screening.  If you have vaginal bleeding after reaching menopause, tell your health care provider.  There are currently no reliable tests available to screen for ovarian cancer.  Lung Cancer Lung cancer screening is recommended for adults 27-41 years old who are at high risk for lung cancer because of a history of smoking. A yearly low-dose CT scan of the lungs is recommended if you:  Currently smoke.  Have a history of at least 30 pack-years of smoking and you currently smoke or have quit within the past 15 years. A pack-year is smoking an average of one pack of cigarettes per day  for one year.  Yearly screening should:  Continue until it has been 15 years since you quit.  Stop if you develop a health problem that would prevent you from having lung cancer treatment.  Colorectal Cancer  This type of cancer can be detected and can often be prevented.  Routine colorectal cancer screening usually begins at age 21 and continues through age 46.  If you have risk factors for colon cancer, your health care provider may recommend that you be screened at an earlier age.  If you have a family history of colorectal cancer, talk with your health care provider about genetic screening.  Your health care provider may also recommend  using home test kits to check for hidden blood in your stool.  A small camera at the end of a tube can be used to examine your colon directly (sigmoidoscopy or colonoscopy). This is done to check for the earliest forms of colorectal cancer.  Direct examination of the colon should be repeated every 5-10 years until age 61. However, if early forms of precancerous polyps or small growths are found or if you have a family history or genetic risk for colorectal cancer, you may need to be screened more often.  Skin Cancer  Check your skin from head to toe regularly.  Monitor any moles. Be sure to tell your health care provider: ? About any new moles or changes in moles, especially if there is a change in a mole's shape or color. ? If you have a mole that is larger than the size of a pencil eraser.  If any of your family members has a history of skin cancer, especially at a young age, talk with your health care provider about genetic screening.  Always use sunscreen. Apply sunscreen liberally and repeatedly throughout the day.  Whenever you are outside, protect yourself by wearing long sleeves, pants, a wide-brimmed hat, and sunglasses.  What should I know about osteoporosis? Osteoporosis is a condition in which bone destruction happens more quickly than new bone creation. After menopause, you may be at an increased risk for osteoporosis. To help prevent osteoporosis or the bone fractures that can happen because of osteoporosis, the following is recommended:  If you are 9-37 years old, get at least 1,000 mg of calcium and at least 600 mg of vitamin D per day.  If you are older than age 69 but younger than age 31, get at least 1,200 mg of calcium and at least 600 mg of vitamin D per day.  If you are older than age 40, get at least 1,200 mg of calcium and at least 800 mg of vitamin D per day.  Smoking and excessive alcohol intake increase the risk of osteoporosis. Eat foods that are rich in  calcium and vitamin D, and do weight-bearing exercises several times each week as directed by your health care provider. What should I know about how menopause affects my mental health? Depression may occur at any age, but it is more common as you become older. Common symptoms of depression include:  Low or sad mood.  Changes in sleep patterns.  Changes in appetite or eating patterns.  Feeling an overall lack of motivation or enjoyment of activities that you previously enjoyed.  Frequent crying spells.  Talk with your health care provider if you think that you are experiencing depression. What should I know about immunizations? It is important that you get and maintain your immunizations. These include:  Tetanus, diphtheria, and pertussis (Tdap)  booster vaccine.  Influenza every year before the flu season begins.  Pneumonia vaccine.  Shingles vaccine.  Your health care provider may also recommend other immunizations. This information is not intended to replace advice given to you by your health care provider. Make sure you discuss any questions you have with your health care provider. Document Released: 01/26/2006 Document Revised: 06/23/2016 Document Reviewed: 09/07/2015 Elsevier Interactive Patient Education  2018 [employer].

## 2018-04-16 LAB — HEPATITIS C ANTIBODY
Hepatitis C Ab: NONREACTIVE
SIGNAL TO CUT-OFF: 0.01 (ref <1.00)

## 2018-05-08 DIAGNOSIS — Z6828 Body mass index (BMI) 28.0-28.9, adult: Secondary | ICD-10-CM | POA: Diagnosis not present

## 2018-05-08 DIAGNOSIS — N958 Other specified menopausal and perimenopausal disorders: Secondary | ICD-10-CM | POA: Diagnosis not present

## 2018-05-08 DIAGNOSIS — Z124 Encounter for screening for malignant neoplasm of cervix: Secondary | ICD-10-CM | POA: Diagnosis not present

## 2018-05-08 DIAGNOSIS — M8588 Other specified disorders of bone density and structure, other site: Secondary | ICD-10-CM | POA: Diagnosis not present

## 2018-07-29 ENCOUNTER — Other Ambulatory Visit: Payer: Self-pay | Admitting: Internal Medicine

## 2018-07-31 NOTE — Telephone Encounter (Signed)
/  Dr. Raliegh Ip, please advise if you would like to refill this medication.  Last ov with you was 04/15/2018 and the last refill for this medication was 02/18/2018.  Thank you.

## 2018-09-03 DIAGNOSIS — L821 Other seborrheic keratosis: Secondary | ICD-10-CM | POA: Diagnosis not present

## 2018-09-03 DIAGNOSIS — L700 Acne vulgaris: Secondary | ICD-10-CM | POA: Diagnosis not present

## 2018-09-03 DIAGNOSIS — L2084 Intrinsic (allergic) eczema: Secondary | ICD-10-CM | POA: Diagnosis not present

## 2018-09-03 DIAGNOSIS — Z85828 Personal history of other malignant neoplasm of skin: Secondary | ICD-10-CM | POA: Diagnosis not present

## 2018-09-26 DIAGNOSIS — Z23 Encounter for immunization: Secondary | ICD-10-CM | POA: Diagnosis not present

## 2018-10-30 DIAGNOSIS — L718 Other rosacea: Secondary | ICD-10-CM | POA: Diagnosis not present

## 2018-12-04 ENCOUNTER — Ambulatory Visit (INDEPENDENT_AMBULATORY_CARE_PROVIDER_SITE_OTHER): Payer: Medicare Other | Admitting: Family Medicine

## 2018-12-04 ENCOUNTER — Encounter: Payer: Self-pay | Admitting: Family Medicine

## 2018-12-04 VITALS — BP 134/82 | HR 90 | Temp 98.4°F | Ht 66.0 in | Wt 168.0 lb

## 2018-12-04 DIAGNOSIS — F4323 Adjustment disorder with mixed anxiety and depressed mood: Secondary | ICD-10-CM

## 2018-12-04 MED ORDER — CEPHALEXIN 500 MG PO CAPS: 500.0000 mg | ORAL_CAPSULE | Freq: Four times a day (QID) | ORAL | Status: DC | PRN

## 2018-12-04 MED ORDER — METRONIDAZOLE 1 % EX GEL: Freq: Every day | CUTANEOUS | 0 refills | Status: DC

## 2018-12-04 MED ORDER — ASPIRIN EC 81 MG PO TBEC: 81.0000 mg | DELAYED_RELEASE_TABLET | Freq: Every day | ORAL | Status: DC

## 2018-12-04 MED ORDER — VENLAFAXINE HCL ER 37.5 MG PO CP24: ORAL_CAPSULE | ORAL | 1 refills | Status: DC

## 2018-12-04 NOTE — Progress Notes (Signed)
Whitney Beasley DOB: 10-13-1946 Encounter date: 12/04/2018  This isa 72 y.o. female who presents to establish care. Chief Complaint  Patient presents with  . Transitions Of Care    History of present illness: No specific concerns today. Started on the effexor back when caring for mom and son had some needs and has been stable on the effexor dose three times weekly for some time.  Allergies usually worse in spring. Uses claritin for these when outdoors more.   Ulcerative colitis:due for colonoscopy in 2020. Feels like she has done much better with bowel issues since taking acidophillus. Last colonoscopy was normal. Thinks that UC might not have been proper dx. Hasn't see GI here.   Osteoporosis:on Evista from obgyn. Taking calcium.    Past Medical History:  Diagnosis Date  . Allergy   . Asthma   . Depression   . GERD (gastroesophageal reflux disease)   . Ulcerative colitis ([place])   . UTI (urinary tract infection)    Past Surgical History:  Procedure Laterality Date  . TONSILLECTOMY Bilateral 1950's   Allergies  Allergen Reactions  . Minocycline Other (See Comments)    Joint pain and Fatigue   No outpatient medications have been marked as taking for the 12/04/18 encounter (Office Visit) with Caren Macadam, MD.   Social History   Tobacco Use  . Smoking status: Never Smoker  . Smokeless tobacco: Never Used  Substance Use Topics  . Alcohol use: Yes    Alcohol/week: 14.0 standard drinks    Types: 14 Glasses of wine per week    Comment: 2 glasses of wine per day;    Family History  Problem Relation Age of Onset  . Diabetes Mother        had MI late 71's  . COPD Father        smoker, but stopped in teh 24  . Colon cancer Paternal Grandfather   . Arthritis Sister   . Diabetes Maternal Grandmother   . Cancer Maternal Grandfather      Review of Systems  Constitutional: Negative for chills, fatigue and fever.  Respiratory: Negative for cough, chest tightness,  shortness of breath and wheezing.   Cardiovascular: Negative for chest pain, palpitations and leg swelling.    Objective:  BP 134/82 (BP Location: Left Arm, Patient Position: Sitting, Cuff Size: Normal)   Pulse 90   Temp 98.4 F (36.9 C) (Oral)   Ht 5' 6"  (1.676 m)   Wt 168 lb (76.2 kg)   LMP  (LMP Unknown) Comment: 2005  SpO2 97%   BMI 27.12 kg/m   Weight: 168 lb (76.2 kg)   BP Readings from Last 3 Encounters:  12/04/18 134/82  04/15/18 110/78  01/29/18 140/80   Wt Readings from Last 3 Encounters:  12/04/18 168 lb (76.2 kg)  04/15/18 165 lb (74.8 kg)  01/29/18 164 lb 1 oz (74.4 kg)    Physical Exam Constitutional:      General: She is not in acute distress.    Appearance: She is well-developed.  Cardiovascular:     Rate and Rhythm: Normal rate and regular rhythm.     Heart sounds: Normal heart sounds. No murmur. No friction rub.     Comments: No lower extremity edema Pulmonary:     Effort: Pulmonary effort is normal. No respiratory distress.     Breath sounds: Normal breath sounds. No wheezing or rales.  Neurological:     Mental Status: She is alert and oriented to person, place, and  time.  Psychiatric:        Behavior: Behavior normal.     Assessment/Plan: 1. Adjustment disorder with mixed anxiety and depressed mood Continue current effexor dose. Keep up with regular exercise program.   Return in about 6 months (around 06/05/2019) for physical exam.  Micheline Rough, MD   Refer for colonoscopy once insurance changes over.

## 2019-02-04 ENCOUNTER — Ambulatory Visit: Payer: Medicare Other

## 2019-02-04 NOTE — Progress Notes (Addendum)
Subjective:   Whitney Beasley is a 73 y.o. female who presents for Medicare Annual (Subsequent) preventive examination.  Review of Systems:  No ROS.  Medicare Wellness Visit. Additional risk factors are reflected in the social history.  Cardiac Risk Factors include: advanced age (>52mn, >>21women);dyslipidemia Sleep patterns: feels rested on waking, gets up 1 time nightly to void and sleeps 7-8 hours nightly.    Home Safety/Smoke Alarms: Feels safe in home. Smoke alarms in place.  Living environment; residence and Firearm Safety: 2-story house. No use or need for DME at this time.  Seat Belt Safety/Bike Helmet: Wears seat belt.   Female:   Pap- 03/2015, no f/u needed d/t age, but advised to double check with Dr. GElzie Rings as pt. Has been receiving PAPs routinely.   Mammo- 02/25/2018, [facility], due 04/2020, pt. Made aware.      CCS-03/2014, due 03/2019. Colonoscopy ordered.      Objective:     Vitals: BP 140/86 (BP Location: Right Arm, Patient Position: Sitting, Cuff Size: Normal)   Pulse 70   Temp 97.8 F (36.6 C)   Resp 16   Ht 5' 5"  (1.651 m)   Wt 168 lb (76.2 kg)   LMP  (LMP Unknown) Comment: 2005  SpO2 98%   BMI 27.96 kg/m   Body mass index is 27.96 kg/m.   BP slightly elevated. Pt. stated she has 'white coat syndrome'. Pt. asymptomatic. Author advised pt. to monitor at home and be mindful of low-sodium diet.  Advanced Directives 02/05/2019 01/29/2018  Does Patient Have a Medical Advance Directive? Yes Yes  [place] -  Does patient want to make changes to medical advance directive? No - Patient declined -  Copy of [place] Chart? No - copy requested -    Tobacco Social History   Tobacco Use  Smoking Status Never Smoker  Smokeless Tobacco Never Used     Counseling given: Not Answered   Past Medical History:    Diagnosis Date  . Allergy   . Asthma   . Depression   . GERD (gastroesophageal reflux disease)   . Ulcerative colitis ([place]   . UTI (urinary tract infection)    Past Surgical History:  Procedure Laterality Date  . TONSILLECTOMY Bilateral 1950's   Family History  Problem Relation Age of Onset  . Diabetes Mother        had MI late 871's . COPD Father        smoker, but stopped in teh 721 . Colon cancer Paternal Grandfather   . Arthritis Sister   . Diabetes Maternal Grandmother   . Cancer Maternal Grandfather    Social History   Socioeconomic History  . Marital status: Married    Spouse name: Not on file  . Number of children: 1  . Years of education: Not on file  . Highest education level: Not on file  Occupational History  . Not on file  Social Needs  . Financial resource strain: Not hard at all  . Food insecurity:    Worry: Never true    Inability: Never true  . Transportation needs:    Medical: No    Non-medical: No  Tobacco Use  . Smoking status: Never Smoker  . Smokeless tobacco: Never Used  Substance and Sexual Activity  . Alcohol use: Yes  Alcohol/week: 14.0 standard drinks    Types: 14 Glasses of wine per week    Comment: 2 glasses of wine per day;   . Drug use: No  . Sexual activity: Yes    Partners: Male  Lifestyle  . Physical activity:    Days per week: 2 days    Minutes per session: 60 min  . Stress: Not at all  Relationships  . Social connections:    Talks on phone: More than three times a week    Gets together: More than three times a week    Attends religious service: Not on file    Active member of club or organization: Yes    Attends meetings of clubs or organizations: More than 4 times per year    Relationship status: Married  Other Topics Concern  . Not on file  Social History Narrative   Lives with husband in 2 story house   Has one son and grandson and step-granddaughter   Active at gym: [employer] cabin [employer] and low-impact  exercise classes about 2X/week.   Enjoys reading and singing in choral groups    Outpatient Encounter Medications as of 02/05/2019  Medication Sig  . aspirin EC 81 MG tablet Take 1 tablet (81 mg total) by mouth daily. (Patient taking differently: Take 81 mg by mouth 3 (three) times a week. )  . B Complex-C (SUPER B COMPLEX PO) Take 1 tablet by mouth daily.  . calcium carbonate 200 MG capsule Take 1,600 mg by mouth daily.   . cephALEXin (KEFLEX) 500 MG capsule Take 1 capsule (500 mg total) by mouth 4 (four) times daily as needed. For acne prn  . clindamycin (CLINDAGEL) 1 % gel Apply topically 2 (two) times daily.  . Glucosamine-Chondroitin 750-600 MG TABS Take 1 tablet by mouth daily.  . Lactobacillus (ACIDOPHILUS) 0.5 MG TABS Take by mouth.  . loratadine (CLARITIN) 10 MG tablet Take 10 mg by mouth daily.  . metroNIDAZOLE (METROGEL) 1 % gel Apply topically daily.  . Multiple Vitamin (MULTIVITAMIN) tablet Take 1 tablet by mouth daily.  Marland Kitchen omeprazole (PRILOSEC) 20 MG capsule Take 20 mg by mouth daily.  . raloxifene (EVISTA) 60 MG tablet Take 60 mg by mouth daily. Prescribed by Dr. Marina Gravel, GYN  . venlafaxine XR (EFFEXOR-XR) 37.5 MG 24 hr capsule Take 1 capsule PO three times weekly.   No facility-administered encounter medications on file as of 02/05/2019.     Activities of Daily Living In your present state of health, do you have any difficulty performing the following activities: 02/05/2019  Hearing? N  Vision? N  Difficulty concentrating or making decisions? N  Walking or climbing stairs? N  Dressing or bathing? N  Doing errands, shopping? N  Preparing Food and eating ? N  Using the Toilet? N  In the past six months, have you accidently leaked urine? N  Do you have problems with loss of bowel control? N  Managing your Medications? N  Managing your Finances? N  Housekeeping or managing your Housekeeping? N  Some recent data might be hidden    Patient Care Team: Caren Macadam, MD as PCP - General (Family Medicine) Marylynn Pearson, MD as Consulting Physician (Obstetrics and Gynecology) Druscilla Brownie, MD as Referring Physician (Dermatology)    Assessment:   This is a routine wellness examination for [name]. Physical assessment deferred to PCP.   Exercise Activities and Dietary recommendations Current Exercise Habits: Structured exercise class, Type of exercise: walking;treadmill(yoga, pilates), Time (Minutes): 60, Frequency (  Times/Week): 2, Weekly Exercise (Minutes/Week): 120, Intensity: Moderate, Exercise limited by: None identified Diet (meal preparation, eat out, water intake, caffeinated beverages, dairy products, fruits and vegetables): in general, a "healthy" diet  , eats take out weekly. Stays mindful of low-fat, low-salt foods but admits she could do better. Tips on choosing alternative options that will lower cholesterol, especially while eating out, provided and reviewed with pt. And husband at side. Pt. States she stays hydrated throughout the day.        Goals    . patient     Will consider exercise routine and establish something that works for you long term     . Patient Stated     Take time to design your exercise program     . Patient Stated     Develop more muscle tone, make healthier food choices.       Fall Risk Fall Risk  02/05/2019 01/29/2018 03/30/2017 11/23/2016 12/31/2014  Falls in the past year? 0 No No No Yes  Number falls in past yr: - - - - 1  Injury with Fall? - - - - No  Risk for fall due to : History of fall(s) - - - Impaired balance/gait  Follow up Falls prevention discussed - - - -    Depression Screen PHQ 2/9 Scores 02/05/2019 01/29/2018 03/30/2017 11/23/2016  PHQ - 2 Score 0 0 1 0  PHQ- 9 Score 0 - - -     Cognitive Function MMSE - Mini Mental State Exam 11/23/2016  Not completed: (No Data)       Ad8 score reviewed for issues:  Issues making decisions: no  Less interest in hobbies / activities:  no  Repeats questions, stories (family complaining): no  Trouble using ordinary gadgets (microwave, computer, phone):no  Forgets the month or year: no  Mismanaging finances: no  Remembering appts: no  Daily problems with thinking and/or memory: no Ad8 score is= 0    Immunization History  Administered Date(s) Administered  . Influenza, High Dose Seasonal PF 10/08/2014, 10/02/2016, 10/08/2017, 09/19/2018  . Influenza-Unspecified 09/17/2017  . Pneumococcal Conjugate-13 05/07/2014  . Pneumococcal Polysaccharide-23 03/30/2017  . Td 12/23/2007  . Zoster 12/02/2009  . Zoster Recombinat (Shingrix) 05/27/2018, 07/30/2018    Qualifies for Shingles Vaccine? Yes, already received full series.  Screening Tests Health Maintenance  Topic Date Due  . COLONOSCOPY  05/19/2019 (Originally 05/29/2017)  . TETANUS/TDAP  02/06/2020 (Originally 12/22/2017)  . MAMMOGRAM  02/26/2019  . INFLUENZA VACCINE  Completed  . DEXA SCAN  Completed  . Hepatitis C Screening  Completed  . PNA vac Low Risk Adult  Completed        Plan:    Bring a copy of your living will and/or healthcare power of attorney to your next office visit.  Continue doing brain stimulating activities (puzzles, reading, adult coloring books, staying active) to keep memory sharp.   Colonoscopy order placed. If you do not hear anything regarding scheduling by end of next week, please give [place] a call.  Continue doing your weight bearing exercises and making good, low-fat, low-salt food choices while eating out. Probably good idea to monitor your BP at home, let us know if top number goes above 140, or bottom number goes above 90, or if you start to experience headache, dizziness, vision changes--all signs of high BP.   Let us know if you needing anything!  I have personally reviewed and noted the following in the patient's chart:   . Medical and  social history . Use of alcohol, tobacco or illicit drugs  . Current medications  and supplements . Functional ability and status . Nutritional status . Physical activity . Advanced directives . List of other physicians . Vitals . Screenings to include cognitive, depression, and falls . Referrals and appointments  In addition, I have reviewed and discussed with patient certain preventive protocols, quality metrics, and best practice recommendations. A written personalized care plan for preventive services as well as general preventive health recommendations were provided to patient.     Alphia Moh, RN  02/05/2019   I have reviewed the documentation for the a AWV provided by the health coach and agree with their documentation.  I was immediately available for any questions.  I agree with Advanced Care Planning discussed at visit.  Micheline Rough, MD

## 2019-02-05 ENCOUNTER — Ambulatory Visit (INDEPENDENT_AMBULATORY_CARE_PROVIDER_SITE_OTHER): Payer: Medicare Other

## 2019-02-05 VITALS — BP 140/86 | HR 70 | Temp 97.8°F | Resp 16 | Ht 65.0 in | Wt 168.0 lb

## 2019-02-05 DIAGNOSIS — Z1211 Encounter for screening for malignant neoplasm of colon: Secondary | ICD-10-CM | POA: Diagnosis not present

## 2019-02-05 DIAGNOSIS — Z0000 Encounter for general adult medical examination without abnormal findings: Secondary | ICD-10-CM

## 2019-02-05 NOTE — Patient Instructions (Addendum)
Bring a copy of your living will and/or healthcare power of attorney to your next office visit.  Continue doing brain stimulating activities (puzzles, reading, adult coloring books, staying active) to keep memory sharp.   Colonoscopy order placed. If you do not hear anything regarding scheduling by end of next week, please give [place] a call.  Continue doing your weight bearing exercises and making good, low-fat, low-salt food choices while eating out. Probably good idea to monitor your BP at home, let us know if top number goes above 140, or bottom number goes above 90, or if you start to experience headache, dizziness, vision changes--all signs of high BP.   Let us know if you needing anything!   Whitney Beasley , Thank you for taking time to come for your Medicare Wellness Visit. I appreciate your ongoing commitment to your health goals. Please review the following plan we discussed and let me know if I can assist you in the future.   These are the goals we discussed: Goals    . patient     Will consider exercise routine and establish something that works for you long term     . Patient Stated     Take time to design your exercise program     . Patient Stated     Develop more muscle tone, make healthier food choices.       This is a list of the screening recommended for you and due dates:  Health Maintenance  Topic Date Due  . Colon Cancer Screening  05/19/2019*  . Tetanus Vaccine  02/06/2020*  . Mammogram  02/26/2019  . Flu Shot  Completed  . DEXA scan (bone density measurement)  Completed  .  Hepatitis C: One time screening is recommended by Center for Disease Control  (CDC) for  adults born from [birth year] through 1963.   Completed  . Pneumonia vaccines  Completed  *Topic was postponed. The date shown is not the original due date.     Fat and Cholesterol Restricted Eating Plan Eating a diet that limits fat and cholesterol may help lower your risk for heart disease and other  conditions. Your body needs fat and cholesterol for basic functions, but eating too much of these things can be harmful to your health. Your health care provider may order lab tests to check your blood fat (lipid) and cholesterol levels. This helps your health care provider understand your risk for certain conditions and whether you need to make diet changes. Work with your health care provider or dietitian to make an eating plan that is right for you. Your plan includes:  Limit your fat intake to ______% or less of your total calories a day.  Limit your saturated fat intake to ______% or less of your total calories a day.  Limit the amount of cholesterol in your diet to less than _________mg a day.  Eat ___________ g of fiber a day. What are tips for following this plan? General guidelines   If you are overweight, work with your health care provider to lose weight safely. Losing just 5-10% of your body weight can improve your overall health and help prevent diseases such as diabetes and heart disease.  Avoid: ? Foods with added sugar. ? Fried foods. ? Foods that contain partially hydrogenated oils, including stick margarine, some tub margarines, cookies, crackers, and other baked goods.  Limit alcohol intake to no more than 1 drink a day for nonpregnant women and 2 drinks a day  for men. One drink equals 12 oz of beer, 5 oz of wine, or 1 oz of hard liquor. Reading food labels  Check food labels for: ? Trans fats, partially hydrogenated oils, or high amounts of saturated fat. Avoid foods that contain saturated fat and trans fat. ? The amount of cholesterol in each serving. Try to eat no more than 200 mg of cholesterol each day. ? The amount of fiber in each serving. Try to eat at least 20-30 g of fiber each day.  Choose foods with healthy fats, such as: ? Monounsaturated and polyunsaturated fats. These include olive and canola oil, flaxseeds, walnuts, almonds, and seeds. ? Omega-3  fats. These are found in foods such as salmon, mackerel, sardines, tuna, flaxseed oil, and ground flaxseeds.  Choose grain products that have whole grains. Look for the word "whole" as the first word in the ingredient list. Cooking  Cook foods using methods other than frying. Baking, boiling, grilling, and broiling are some healthy options.  Eat more home-cooked food and less restaurant, buffet, and fast food.  Avoid cooking using saturated fats. ? Animal sources of saturated fats include meats, butter, and cream. ? Plant sources of saturated fats include palm oil, palm kernel oil, and coconut oil. Meal planning   At meals, imagine dividing your plate into fourths: ? Fill one-half of your plate with vegetables and green salads. ? Fill one-fourth of your plate with whole grains. ? Fill one-fourth of your plate with lean protein foods.  Eat fish that is high in omega-3 fats at least two times a week.  Eat more foods that contain fiber, such as whole grains, beans, apples, broccoli, carrots, peas, and barley. These foods help promote healthy cholesterol levels in the blood. Recommended foods Grains  Whole grains, such as whole wheat or whole grain breads, crackers, cereals, and pasta. Unsweetened oatmeal, bulgur, barley, quinoa, or brown rice. Corn or whole wheat flour tortillas. Vegetables  Fresh or frozen vegetables (raw, steamed, roasted, or grilled). Green salads. Fruits  All fresh, canned (in natural juice), or frozen fruits. Meats and other protein foods  Ground beef (85% or leaner), grass-fed beef, or beef trimmed of fat. Skinless chicken or [place]. Ground chicken or [place]. Pork trimmed of fat. All fish and seafood. Egg whites. Dried beans, peas, or lentils. Unsalted nuts or seeds. Unsalted canned beans. Natural nut butters without added sugar and oil. Dairy  Low-fat or nonfat dairy products, such as skim or 1% milk, 2% or reduced-fat cheeses, low-fat and fat-free ricotta  or cottage cheese, or plain low-fat and nonfat yogurt. Fats and oils  Tub margarine without trans fats. Light or reduced-fat mayonnaise and salad dressings. Avocado. Olive, canola, sesame, or safflower oils. The items listed above may not be a complete list of recommended foods or beverages. Contact your dietitian for more options. Foods to avoid Grains  White bread. White pasta. White rice. Cornbread. Bagels, pastries, and croissants. Crackers and snack foods that contain trans fat and hydrogenated oils. Vegetables  Vegetables cooked in cheese, cream, or butter sauce. Fried vegetables. Fruits  Canned fruit in heavy syrup. Fruit in cream or butter sauce. Fried fruit. Meats and other protein foods  Fatty cuts of meat. Ribs, chicken wings, bacon, sausage, bologna, salami, chitterlings, fatback, hot dogs, bratwurst, and packaged lunch meats. Liver and organ meats. Whole eggs and egg yolks. Chicken and [place] with skin. Fried meat. Dairy  Whole or 2% milk, cream, half-and-half, and cream cheese. Whole milk cheeses. Whole-fat or sweetened yogurt.  Full-fat cheeses. Nondairy creamers and whipped toppings. Processed cheese, cheese spreads, and cheese curds. Beverages  Alcohol. Sugar-sweetened drinks such as sodas, lemonade, and fruit drinks. Fats and oils  Butter, stick margarine, lard, shortening, ghee, or bacon fat. Coconut, palm kernel, and palm oils. Sweets and desserts  Corn syrup, sugars, honey, and molasses. Candy. Jam and jelly. Syrup. Sweetened cereals. Cookies, pies, cakes, donuts, muffins, and ice cream. The items listed above may not be a complete list of foods and beverages to avoid. Contact your dietitian for more information. Summary  Your body needs fat and cholesterol for basic functions. However, eating too much of these things can be harmful to your health.  Work with your health care provider and dietitian to follow a diet low in fat and cholesterol. Doing this may help  lower your risk for heart disease and other conditions.  Choose healthy fats, such as monounsaturated and polyunsaturated fats, and foods high in omega-3 fatty acids.  Eat fiber-rich foods, such as whole grains, beans, peas, fruits, and vegetables.  Limit or avoid alcohol, fried foods, and foods high in saturated fats, partially hydrogenated oils, and sugar. This information is not intended to replace advice given to you by your health care provider. Make sure you discuss any questions you have with your health care provider. Document Released: 12/04/2005 Document Revised: 08/21/2017 Document Reviewed: 08/21/2017 Elsevier Interactive Patient Education  2019 [place] Maintenance, Female Adopting a healthy lifestyle and getting preventive care can go a long way to promote health and wellness. Talk with your health care provider about what schedule of regular examinations is right for you. This is a good chance for you to check in with your provider about disease prevention and staying healthy. In between checkups, there are plenty of things you can do on your own. Experts have done a lot of research about which lifestyle changes and preventive measures are most likely to keep you healthy. Ask your health care provider for more information. Weight and diet Eat a healthy diet  Be sure to include plenty of vegetables, fruits, low-fat dairy products, and lean protein.  Do not eat a lot of foods high in solid fats, added sugars, or salt.  Get regular exercise. This is one of the most important things you can do for your health. ? Most adults should exercise for at least 150 minutes each week. The exercise should increase your heart rate and make you sweat (moderate-intensity exercise). ? Most adults should also do strengthening exercises at least twice a week. This is in addition to the moderate-intensity exercise. Maintain a healthy weight  Body mass index (BMI) is a measurement  that can be used to identify possible weight problems. It estimates body fat based on height and weight. Your health care provider can help determine your BMI and help you achieve or maintain a healthy weight.  For females 57 years of age and older: ? A BMI below 18.5 is considered underweight. ? A BMI of 18.5 to 24.9 is normal. ? A BMI of 25 to 29.9 is considered overweight. ? A BMI of 30 and above is considered obese. Watch levels of cholesterol and blood lipids  You should start having your blood tested for lipids and cholesterol at 71 years of age, then have this test every 5 years.  You may need to have your cholesterol levels checked more often if: ? Your lipid or cholesterol levels are high. ? You are older than 71 years of age. ?  You are at high risk for heart disease. Cancer screening Lung Cancer  Lung cancer screening is recommended for adults 53-51 years old who are at high risk for lung cancer because of a history of smoking.  A yearly low-dose CT scan of the lungs is recommended for people who: ? Currently smoke. ? Have quit within the past 15 years. ? Have at least a 30-pack-year history of smoking. A pack year is smoking an average of one pack of cigarettes a day for 1 year.  Yearly screening should continue until it has been 15 years since you quit.  Yearly screening should stop if you develop a health problem that would prevent you from having lung cancer treatment. Breast Cancer  Practice breast self-awareness. This means understanding how your breasts normally appear and feel.  It also means doing regular breast self-exams. Let your health care provider know about any changes, no matter how small.  If you are in your 20s or 30s, you should have a clinical breast exam (CBE) by a health care provider every 1-3 years as part of a regular health exam.  If you are 39 or older, have a CBE every year. Also consider having a breast X-ray (mammogram) every year.  If  you have a family history of breast cancer, talk to your health care provider about genetic screening.  If you are at high risk for breast cancer, talk to your health care provider about having an MRI and a mammogram every year.  Breast cancer gene (BRCA) assessment is recommended for women who have family members with BRCA-related cancers. BRCA-related cancers include: ? Breast. ? Ovarian. ? Tubal. ? Peritoneal cancers.  Results of the assessment will determine the need for genetic counseling and BRCA1 and BRCA2 testing. Cervical Cancer Your health care provider may recommend that you be screened regularly for cancer of the pelvic organs (ovaries, uterus, and vagina). This screening involves a pelvic examination, including checking for microscopic changes to the surface of your cervix (Pap test). You may be encouraged to have this screening done every 3 years, beginning at age 72.  For women ages 71-65, health care providers may recommend pelvic exams and Pap testing every 3 years, or they may recommend the Pap and pelvic exam, combined with testing for human papilloma virus (HPV), every 5 years. Some types of HPV increase your risk of cervical cancer. Testing for HPV may also be done on women of any age with unclear Pap test results.  Other health care providers may not recommend any screening for nonpregnant women who are considered low risk for pelvic cancer and who do not have symptoms. Ask your health care provider if a screening pelvic exam is right for you.  If you have had past treatment for cervical cancer or a condition that could lead to cancer, you need Pap tests and screening for cancer for at least 20 years after your treatment. If Pap tests have been discontinued, your risk factors (such as having a new sexual partner) need to be reassessed to determine if screening should resume. Some women have medical problems that increase the chance of getting cervical cancer. In these cases,  your health care provider may recommend more frequent screening and Pap tests. Colorectal Cancer  This type of cancer can be detected and often prevented.  Routine colorectal cancer screening usually begins at 71 years of age and continues through 71 years of age.  Your health care provider may recommend screening at an earlier age  if you have risk factors for colon cancer.  Your health care provider may also recommend using home test kits to check for hidden blood in the stool.  A small camera at the end of a tube can be used to examine your colon directly (sigmoidoscopy or colonoscopy). This is done to check for the earliest forms of colorectal cancer.  Routine screening usually begins at age 82.  Direct examination of the colon should be repeated every 5-10 years through 71 years of age. However, you may need to be screened more often if early forms of precancerous polyps or small growths are found. Skin Cancer  Check your skin from head to toe regularly.  Tell your health care provider about any new moles or changes in moles, especially if there is a change in a mole's shape or color.  Also tell your health care provider if you have a mole that is larger than the size of a pencil eraser.  Always use sunscreen. Apply sunscreen liberally and repeatedly throughout the day.  Protect yourself by wearing long sleeves, pants, a wide-brimmed hat, and sunglasses whenever you are outside. Heart disease, diabetes, and high blood pressure  High blood pressure causes heart disease and increases the risk of stroke. High blood pressure is more likely to develop in: ? People who have blood pressure in the high end of the normal range (130-139/85-89 mm Hg). ? People who are overweight or obese. ? People who are African American.  If you are 64-5 years of age, have your blood pressure checked every 3-5 years. If you are 11 years of age or older, have your blood pressure checked every year. You  should have your blood pressure measured twice-once when you are at a hospital or clinic, and once when you are not at a hospital or clinic. Record the average of the two measurements. To check your blood pressure when you are not at a hospital or clinic, you can use: ? An automated blood pressure machine at a pharmacy. ? A home blood pressure monitor.  If you are between 34 years and 84 years old, ask your health care provider if you should take aspirin to prevent strokes.  Have regular diabetes screenings. This involves taking a blood sample to check your fasting blood sugar level. ? If you are at a normal weight and have a low risk for diabetes, have this test once every three years after 71 years of age. ? If you are overweight and have a high risk for diabetes, consider being tested at a younger age or more often. Preventing infection Hepatitis B  If you have a higher risk for hepatitis B, you should be screened for this virus. You are considered at high risk for hepatitis B if: ? You were born in a country where hepatitis B is common. Ask your health care provider which countries are considered high risk. ? Your parents were born in a high-risk country, and you have not been immunized against hepatitis B (hepatitis B vaccine). ? You have HIV or AIDS. ? You use needles to inject street drugs. ? You live with someone who has hepatitis B. ? You have had sex with someone who has hepatitis B. ? You get hemodialysis treatment. ? You take certain medicines for conditions, including cancer, organ transplantation, and autoimmune conditions. Hepatitis C  Blood testing is recommended for: ? Everyone born from [birth year] through 1963. ? Anyone with known risk factors for hepatitis C. Sexually transmitted infections (STIs)  You should be screened for sexually transmitted infections (STIs) including gonorrhea and chlamydia if: ? You are sexually active and are younger than 71 years of age. ? You are  older than 71 years of age and your health care provider tells you that you are at risk for this type of infection. ? Your sexual activity has changed since you were last screened and you are at an increased risk for chlamydia or gonorrhea. Ask your health care provider if you are at risk.  If you do not have HIV, but are at risk, it may be recommended that you take a prescription medicine daily to prevent HIV infection. This is called pre-exposure prophylaxis (PrEP). You are considered at risk if: ? You are sexually active and do not regularly use condoms or know the HIV status of your partner(s). ? You take drugs by injection. ? You are sexually active with a partner who has HIV. Talk with your health care provider about whether you are at high risk of being infected with HIV. If you choose to begin PrEP, you should first be tested for HIV. You should then be tested every 3 months for as long as you are taking PrEP. Pregnancy  If you are premenopausal and you may become pregnant, ask your health care provider about preconception counseling.  If you may become pregnant, take 400 to 800 micrograms (mcg) of folic acid every day.  If you want to prevent pregnancy, talk to your health care provider about birth control (contraception). Osteoporosis and menopause  Osteoporosis is a disease in which the bones lose minerals and strength with aging. This can result in serious bone fractures. Your risk for osteoporosis can be identified using a bone density scan.  If you are 34 years of age or older, or if you are at risk for osteoporosis and fractures, ask your health care provider if you should be screened.  Ask your health care provider whether you should take a calcium or vitamin D supplement to lower your risk for osteoporosis.  Menopause may have certain physical symptoms and risks.  Hormone replacement therapy may reduce some of these symptoms and risks. Talk to your health care provider  about whether hormone replacement therapy is right for you. Follow these instructions at home:  Schedule regular health, dental, and eye exams.  Stay current with your immunizations.  Do not use any tobacco products including cigarettes, chewing tobacco, or electronic cigarettes.  If you are pregnant, do not drink alcohol.  If you are breastfeeding, limit how much and how often you drink alcohol.  Limit alcohol intake to no more than 1 drink per day for nonpregnant women. One drink equals 12 ounces of beer, 5 ounces of wine, or 1 ounces of hard liquor.  Do not use street drugs.  Do not share needles.  Ask your health care provider for help if you need support or information about quitting drugs.  Tell your health care provider if you often feel depressed.  Tell your health care provider if you have ever been abused or do not feel safe at home. This information is not intended to replace advice given to you by your health care provider. Make sure you discuss any questions you have with your health care provider. Document Released: 06/19/2011 Document Revised: 05/11/2016 Document Reviewed: 09/07/2015 Elsevier Interactive Patient Education  2019 [employer].

## 2019-02-19 ENCOUNTER — Other Ambulatory Visit: Payer: Self-pay | Admitting: Family Medicine

## 2019-02-19 DIAGNOSIS — Z1231 Encounter for screening mammogram for malignant neoplasm of breast: Secondary | ICD-10-CM

## 2019-03-21 ENCOUNTER — Other Ambulatory Visit: Payer: Self-pay

## 2019-03-21 MED ORDER — VENLAFAXINE HCL ER 37.5 MG PO CP24: ORAL_CAPSULE | ORAL | 0 refills | Status: DC

## 2019-04-11 ENCOUNTER — Ambulatory Visit: Payer: Medicare Other

## 2019-06-03 ENCOUNTER — Ambulatory Visit
Admission: RE | Admit: 2019-06-03 | Discharge: 2019-06-03 | Disposition: A | Discharge status: Home / Self Care | Payer: Medicare Other | Source: Ambulatory Visit | Attending: Family Medicine | Admitting: Family Medicine

## 2019-06-03 ENCOUNTER — Other Ambulatory Visit: Payer: Self-pay

## 2019-06-03 DIAGNOSIS — Z1231 Encounter for screening mammogram for malignant neoplasm of breast: Secondary | ICD-10-CM

## 2019-06-25 ENCOUNTER — Ambulatory Visit: Payer: Medicare Other | Admitting: Family Medicine

## 2019-07-02 ENCOUNTER — Encounter: Payer: Self-pay | Admitting: Family Medicine

## 2019-07-02 ENCOUNTER — Other Ambulatory Visit: Payer: Self-pay

## 2019-07-02 ENCOUNTER — Ambulatory Visit (INDEPENDENT_AMBULATORY_CARE_PROVIDER_SITE_OTHER): Payer: Medicare Other | Admitting: Family Medicine

## 2019-07-02 VITALS — BP 118/84 | HR 76 | Temp 98.9°F | Ht 65.0 in | Wt 163.3 lb

## 2019-07-02 DIAGNOSIS — M79645 Pain in left finger(s): Secondary | ICD-10-CM

## 2019-07-02 DIAGNOSIS — Z1211 Encounter for screening for malignant neoplasm of colon: Secondary | ICD-10-CM

## 2019-07-02 DIAGNOSIS — E785 Hyperlipidemia, unspecified: Secondary | ICD-10-CM

## 2019-07-02 DIAGNOSIS — M818 Other osteoporosis without current pathological fracture: Secondary | ICD-10-CM

## 2019-07-02 DIAGNOSIS — K5100 Ulcerative (chronic) pancolitis without complications: Secondary | ICD-10-CM

## 2019-07-02 DIAGNOSIS — F4323 Adjustment disorder with mixed anxiety and depressed mood: Secondary | ICD-10-CM

## 2019-07-02 LAB — COMPREHENSIVE METABOLIC PANEL
ALT: 16 U/L (ref 0–35)
AST: 18 U/L (ref 0–37)
Albumin: 4.5 g/dL (ref 3.5–5.2)
Alkaline Phosphatase: 70 U/L (ref 39–117)
BUN: 16 mg/dL (ref 6–23)
CO2: 27 mEq/L (ref 19–32)
Calcium: 9.4 mg/dL (ref 8.4–10.5)
Chloride: 107 mEq/L (ref 96–112)
Creatinine, Ser: 0.91 mg/dL (ref 0.40–1.20)
GFR: 60.93 mL/min (ref >60.00)
Glucose, Bld: 84 mg/dL (ref 70–99)
Potassium: 4.3 mEq/L (ref 3.5–5.1)
Sodium: 142 mEq/L (ref 135–145)
Total Bilirubin: 0.7 mg/dL (ref 0.2–1.2)
Total Protein: 6.6 g/dL (ref 6.0–8.3)

## 2019-07-02 LAB — VITAMIN D 25 HYDROXY (VIT D DEFICIENCY, FRACTURES): VITD: 67.27 ng/mL (ref 30.00–100.00)

## 2019-07-02 LAB — LIPID PANEL
Cholesterol: 214 mg/dL — ABNORMAL HIGH (ref 0–200)
HDL: 66.7 mg/dL (ref >39.00)
LDL Cholesterol: 120 mg/dL — ABNORMAL HIGH (ref 0–99)
NonHDL: 147.57
Total CHOL/HDL Ratio: 3
Triglycerides: 136 mg/dL (ref 0.0–149.0)
VLDL: 27.2 mg/dL (ref 0.0–40.0)

## 2019-07-02 MED ORDER — VENLAFAXINE HCL ER 37.5 MG PO CP24: ORAL_CAPSULE | ORAL | 3 refills | Status: DC

## 2019-07-02 NOTE — Progress Notes (Addendum)
Whitney Beasley DOB: Oct 24, 1946 Encounter date: 07/02/2019  This is a 73 y.o. female who presents for chronic condition follow up  History of present illness/Additional concerns: Doing well overall. Trying to be cautious with COVID.   Slammed finger in past - in last 2 months or so getting some bending at joint. Sometimes it does hurt some.   Adjustment disorder/anxiety/depression: effexor 3x/week Osteoporosis: evista from obgyn, calcium. Ulcerative colitis: due for colonoscopy this year Follows with [facility] and Dr. Pearline Cables.   Past Medical History:  Diagnosis Date  . Allergy   . Asthma   . Depression   . GERD (gastroesophageal reflux disease)   . Ulcerative colitis ([place])   . UTI (urinary tract infection)    Past Surgical History:  Procedure Laterality Date  . TONSILLECTOMY Bilateral 1950's   Allergies  Allergen Reactions  . Minocycline Other (See Comments)    Joint pain and Fatigue   Current Meds  Medication Sig  . aspirin EC 81 MG tablet Take 1 tablet (81 mg total) by mouth daily. (Patient taking differently: Take 81 mg by mouth 3 (three) times a week. )  . B Complex-C (SUPER B COMPLEX PO) Take 1 tablet by mouth daily.  . calcium carbonate 200 MG capsule Take 1,600 mg by mouth daily.   . cephALEXin (KEFLEX) 500 MG capsule Take 1 capsule (500 mg total) by mouth 4 (four) times daily as needed. For acne prn  . clindamycin (CLINDAGEL) 1 % gel Apply topically 2 (two) times daily.  . Glucosamine-Chondroitin 750-600 MG TABS Take 1 tablet by mouth daily.  . Lactobacillus (ACIDOPHILUS) 0.5 MG TABS Take by mouth.  . loratadine (CLARITIN) 10 MG tablet Take 10 mg by mouth daily.  . metroNIDAZOLE (METROGEL) 1 % gel Apply topically daily.  . Multiple Vitamin (MULTIVITAMIN) tablet Take 1 tablet by mouth daily.  Marland Kitchen omeprazole (PRILOSEC) 20 MG capsule Take 20 mg by mouth daily.  . raloxifene (EVISTA) 60 MG tablet Take 60 mg by mouth daily. Prescribed by Dr. Marina Gravel,  GYN  . tretinoin (RETIN-A) 0.025 % gel Apply topically as needed.  . venlafaxine XR (EFFEXOR-XR) 37.5 MG 24 hr capsule Take 1 capsule PO three times weekly.  Marland Kitchen VITAMIN D PO Take 1,600 mg by mouth daily.  . [DISCONTINUED] venlafaxine XR (EFFEXOR-XR) 37.5 MG 24 hr capsule Take 1 capsule PO three times weekly.   Social History   Tobacco Use  . Smoking status: Never Smoker  . Smokeless tobacco: Never Used  Substance Use Topics  . Alcohol use: Yes    Alcohol/week: 14.0 standard drinks    Types: 14 Glasses of wine per week    Comment: 2 glasses of wine per day;    Family History  Problem Relation Age of Onset  . Diabetes Mother        had MI late 82's  . COPD Father        smoker, but stopped in teh 64  . Colon cancer Paternal Grandfather   . Arthritis Sister   . Diabetes Maternal Grandmother   . Cancer Maternal Grandfather      Review of Systems  Constitutional: Negative for activity change, appetite change, chills, fatigue, fever and unexpected weight change.  HENT: Negative for congestion, ear pain, hearing loss, sinus pressure, sinus pain, sore throat and trouble swallowing.   Eyes: Negative for pain and visual disturbance.  Respiratory: Negative for cough, chest tightness, shortness of breath and wheezing.   Cardiovascular: Negative for chest pain,  palpitations and leg swelling.  Gastrointestinal: Negative for abdominal pain, blood in stool, constipation, diarrhea, nausea and vomiting.  Genitourinary: Negative for difficulty urinating and menstrual problem.  Musculoskeletal: Negative for arthralgias and back pain.  Skin: Negative for rash.  Neurological: Negative for dizziness, weakness, numbness and headaches.  Hematological: Negative for adenopathy. Does not bruise/bleed easily.  Psychiatric/Behavioral: Negative for sleep disturbance and suicidal ideas. The patient is not nervous/anxious.     CBC:  Lab Results  Component Value Date   WBC 5.3 04/15/2018   HGB 14.3  04/15/2018   HCT 42.2 04/15/2018   MCHC 33.8 04/15/2018   RDW 13.4 04/15/2018   PLT 216.0 04/15/2018   CMP: Lab Results  Component Value Date   NA 143 04/15/2018   K 4.0 04/15/2018   CL 107 04/15/2018   CO2 26 04/15/2018   GLUCOSE 87 04/15/2018   BUN 18 04/15/2018   CREATININE 0.77 04/15/2018   CALCIUM 9.4 04/15/2018   PROT 6.5 04/15/2018   BILITOT 0.6 04/15/2018   ALKPHOS 63 04/15/2018   ALT 20 04/15/2018   AST 20 04/15/2018   LIPID: Lab Results  Component Value Date   CHOL 225 (H) 04/15/2018   TRIG 214.0 (H) 04/15/2018   HDL 59.00 04/15/2018   LDLCALC 132 (H) 03/30/2017    Objective:  BP 118/84 (BP Location: Left Arm, Patient Position: Sitting, Cuff Size: Normal)   Pulse 76   Temp 98.9 F (37.2 C) (Temporal)   Ht 5' 5"  (1.651 m)   Wt 163 lb 4.8 oz (74.1 kg)   LMP  (Approximate) Comment: 2005  SpO2 98%   BMI 27.17 kg/m   Weight: 163 lb 4.8 oz (74.1 kg)   BP Readings from Last 3 Encounters:  07/02/19 118/84  02/05/19 140/86  12/04/18 134/82   Wt Readings from Last 3 Encounters:  07/02/19 163 lb 4.8 oz (74.1 kg)  02/05/19 168 lb (76.2 kg)  12/04/18 168 lb (76.2 kg)    Physical Exam Constitutional:      General: She is not in acute distress.    Appearance: She is well-developed.  HENT:     Head: Normocephalic and atraumatic.     Right Ear: External ear normal.     Left Ear: External ear normal.     Mouth/Throat:     Pharynx: No oropharyngeal exudate.  Eyes:     Conjunctiva/sclera: Conjunctivae normal.     Pupils: Pupils are equal, round, and reactive to light.  Neck:     Musculoskeletal: Normal range of motion and neck supple.     Thyroid: No thyromegaly.  Cardiovascular:     Rate and Rhythm: Normal rate and regular rhythm.     Heart sounds: Normal heart sounds. No murmur. No friction rub. No gallop.   Pulmonary:     Effort: Pulmonary effort is normal.     Breath sounds: Normal breath sounds.  Abdominal:     General: Bowel sounds are  normal. There is no distension.     Palpations: Abdomen is soft. There is no mass.     Tenderness: There is no abdominal tenderness. There is no guarding.     Hernia: No hernia is present.  Musculoskeletal: Normal range of motion.        General: No tenderness or deformity.  Lymphadenopathy:     Cervical: No cervical adenopathy.  Skin:    General: Skin is warm and dry.     Findings: No rash.  Neurological:     Mental Status:  She is alert and oriented to person, place, and time.     Deep Tendon Reflexes: Reflexes normal.     Reflex Scores:      Tricep reflexes are 2+ on the right side and 2+ on the left side.      Bicep reflexes are 2+ on the right side and 2+ on the left side.      Brachioradialis reflexes are 2+ on the right side and 2+ on the left side.      Patellar reflexes are 2+ on the right side and 2+ on the left side. Psychiatric:        Speech: Speech normal.        Behavior: Behavior normal.        Thought Content: Thought content normal.     Assessment/Plan: Health Maintenance Due  Topic Date Due  . COLONOSCOPY  05/29/2017   Health Maintenance reviewed. Referred for colonoscopy.  1. Ulcerative pancolitis without complication ([place]) Asymptomatic; doing well overall.  - Ambulatory referral to Gastroenterology  2. Adjustment disorder with mixed anxiety and depressed mood Stable.  - venlafaxine XR (EFFEXOR-XR) 37.5 MG 24 hr capsule; Take 1 capsule PO three times weekly.  Dispense: 75 capsule; Refill: 3  3. Other osteoporosis, unspecified pathological fracture presence Keep up with weight bearing exercise. Continue Evista.  - VITAMIN D 25 Hydroxy (Vit-D Deficiency, Fractures); Future  4. Finger pain, left - Ambulatory referral to Hand Surgery  5. Colon cancer screening - Ambulatory referral to Gastroenterology  6. Dyslipidemia - Comprehensive metabolic panel; Future - Lipid panel; Future  Return in about 6 months (around 01/02/2020) for Chronic condition  visit/AWV will be due in Feb.  (set up AWV after bloodwork results)  Micheline Rough, MD

## 2019-07-02 NOTE — Addendum Note (Signed)
Addended by: Elmer Picker on: 07/02/2019 12:16 PM   Modules accepted: Orders

## 2019-07-03 ENCOUNTER — Telehealth: Payer: Self-pay | Admitting: Gastroenterology

## 2019-07-03 NOTE — Telephone Encounter (Signed)
DOD July 03, 2019  Dr. Fuller Plan, there is a referral for pt to have a screening colonoscopy.  Pt has been dx with ulcerative colitis in 2003; symptoms stable.  Previous colon report from 89 in [place] will be sent to you for review.

## 2019-07-03 NOTE — Telephone Encounter (Signed)
Pt reported that parents had hx of polyps and PGF had colon cancer.

## 2019-07-16 ENCOUNTER — Other Ambulatory Visit: Payer: Self-pay

## 2019-07-16 ENCOUNTER — Ambulatory Visit: Payer: Medicare Other | Admitting: *Deleted

## 2019-07-16 VITALS — Ht 65.0 in | Wt 163.0 lb

## 2019-07-16 DIAGNOSIS — K5180 Other ulcerative colitis without complications: Secondary | ICD-10-CM

## 2019-07-16 DIAGNOSIS — Z8371 Family history of colonic polyps: Secondary | ICD-10-CM

## 2019-07-16 MED ORDER — NA SULFATE-K SULFATE-MG SULF 17.5-3.13-1.6 GM/177ML PO SOLN: ORAL | 0 refills | Status: DC

## 2019-07-16 NOTE — Progress Notes (Signed)
Patient's pre-visit was done today over the phone with the patient due to COVID-19 pandemic. Name,DOB and address verified. Insurance verified. Packet of Prep instructions mailed to patient including copy of a consent form and pre-procedure patient acknowledgement form-pt is aware.  Patient understands to call us back with any questions or concerns.  Patient denies any allergies to eggs or soy. Patient denies any problems with anesthesia/sedation. Patient denies any oxygen use at home. Patient denies taking any diet/weight loss medications or blood thinners. Pt is aware that care partner will wait in the car during procedure; if they feel like they will be too hot to wait in the car; they may wait in the lobby.  We want them to wear a mask (we do not have any that we can provide them), practice social distancing, and we will check their temperatures when they get here.  I did remind patient that their care partner needs to stay in the parking lot the entire time. Pt will wear mask into building.

## 2019-07-18 ENCOUNTER — Telehealth: Payer: Self-pay | Admitting: Gastroenterology

## 2019-07-18 NOTE — Telephone Encounter (Signed)
Patient notified okay to keep the procedure as scheduled.

## 2019-07-18 NOTE — Telephone Encounter (Signed)
Pt called to inform that she is having hand surgery on 8/12 under local sedation. She is scheduled for colon a week after on 8/18, she wants to know if she needs to r/s her procedure. Pls call her.

## 2019-07-30 ENCOUNTER — Other Ambulatory Visit: Payer: Self-pay

## 2019-07-30 ENCOUNTER — Encounter: Payer: Self-pay | Admitting: Gastroenterology

## 2019-07-30 HISTORY — PX: OTHER SURGICAL HISTORY: SHX169

## 2019-08-04 ENCOUNTER — Telehealth: Payer: Self-pay | Admitting: Gastroenterology

## 2019-08-05 ENCOUNTER — Other Ambulatory Visit: Payer: Self-pay

## 2019-08-05 ENCOUNTER — Ambulatory Visit (AMBULATORY_SURGERY_CENTER): Payer: Medicare Other | Admitting: Gastroenterology

## 2019-08-05 ENCOUNTER — Encounter: Payer: Self-pay | Admitting: Gastroenterology

## 2019-08-05 VITALS — BP 112/74 | HR 63 | Temp 97.5°F | Resp 12 | Ht 65.0 in | Wt 163.0 lb

## 2019-08-05 DIAGNOSIS — K5180 Other ulcerative colitis without complications: Secondary | ICD-10-CM | POA: Diagnosis not present

## 2019-08-05 DIAGNOSIS — Z8371 Family history of colonic polyps: Secondary | ICD-10-CM

## 2019-08-05 DIAGNOSIS — K5000 Crohn's disease of small intestine without complications: Secondary | ICD-10-CM | POA: Diagnosis not present

## 2019-08-05 MED ORDER — SODIUM CHLORIDE 0.9 % IV SOLN: 500.0000 mL | INTRAVENOUS | Status: DC

## 2019-08-05 NOTE — Progress Notes (Signed)
To PACU, VSS. Report to [email or web address] 

## 2019-08-05 NOTE — Progress Notes (Signed)
Step. Whitney Beasley, Whitney Beasley check in, [place] [facility].  Pt's states no medical or surgical changes since previsit

## 2019-08-05 NOTE — Op Note (Signed)
[facility] Patient Name: Whitney Beasley Procedure Date: 08/05/2019 10:31 AM MRN: [id number] Endoscopist: Ladene Artist , MD Age: 73 Referring MD:  Date of Birth: May 04, 1946 Gender: Female Account #: [id number] Procedure:                Colonoscopy Indications:              High risk colon cancer surveillance: history of UC                            however no records and no IBD meds for years.                            Family history of colon polyps, mother and father. Medicines:                Monitored Anesthesia Care Procedure:                Pre-Anesthesia Assessment:                           - Prior to the procedure, a History and Physical                            was performed, and patient medications and                            allergies were reviewed. The patient's tolerance of                            previous anesthesia was also reviewed. The risks                            and benefits of the procedure and the sedation                            options and risks were discussed with the patient.                            All questions were answered, and informed consent                            was obtained. Prior Anticoagulants: The patient has                            taken no previous anticoagulant or antiplatelet                            agents. ASA Grade Assessment: II - A patient with                            mild systemic disease. After reviewing the risks                            and benefits, the patient was deemed in  satisfactory condition to undergo the procedure.                           After obtaining informed consent, the colonoscope                            was passed under direct vision. Throughout the                            procedure, the patient's blood pressure, pulse, and                            oxygen saturations were monitored continuously. The                            Colonoscope  was introduced through the anus and                            advanced to the the terminal ileum, with                            identification of the appendiceal orifice and IC                            valve. The ileocecal valve, appendiceal orifice,                            and rectum were photographed. The quality of the                            bowel preparation was excellent. The colonoscopy                            was performed without difficulty. The patient                            tolerated the procedure well. Scope In: 10:58:17 AM Scope Out: 11:15:46 AM Scope Withdrawal Time: 0 hours 12 minutes 56 seconds  Total Procedure Duration: 0 hours 17 minutes 29 seconds  Findings:                 The perianal and digital rectal examinations were                            normal.                           The terminal ileum contained two patchy                            non-bleeding erosions. No stigmata of recent                            bleeding were seen. Biopsies were taken with a cold  forceps for histology.                           A single localized non-bleeding erosion was found                            at the ileocecal valve. No stigmata of recent                            bleeding were seen. Biopsies were taken with a cold                            forceps for histology.                           External and internal hemorrhoids were found during                            retroflexion. The hemorrhoids were moderate and                            Grade I (internal hemorrhoids that do not prolapse).                           The remainder of the colon appeared normal.                            Biopsies were taken every 10 cm with a cold forceps                            from the entire colon for ulcerative colitis                            surveillance. These biopsy specimens from the                            entire colon  were sent to Pathology. Complications:            No immediate complications. Estimated blood loss:                            None. Estimated Blood Loss:     Estimated blood loss: none. Impression:               - Two erosions in the terminal ileum. Biopsied.                           - A single erosion at the ileocecal valve. Biopsied.                           - The remainder of the colon appeared normal.                            Biopsied.                           -  External and internal hemorrhoids. Recommendation:           - Repeat colonoscopy timing after studies are                            complete for surveillance based on pathology                            results.                           - Patient has a contact number available for                            emergencies. The signs and symptoms of potential                            delayed complications were discussed with the                            patient. Return to normal activities tomorrow.                            Written discharge instructions were provided to the                            patient.                           - Resume previous diet.                           - Continue present medications.                           - Request prior GI, colonoscopy, pathology records.                           - Await pathology results. Ladene Artist, MD 08/05/2019 11:23:56 AM This report has been signed electronically.

## 2019-08-05 NOTE — Progress Notes (Signed)
Called to room to assist during endoscopic procedure.  Patient ID and intended procedure confirmed with present staff. Received instructions for my participation in the procedure from the performing physician.  

## 2019-08-05 NOTE — Patient Instructions (Signed)
Await pathology results.  YOU HAD AN ENDOSCOPIC PROCEDURE TODAY AT [facility]:   Refer to the procedure report that was given to you for any specific questions about what was found during the examination.  If the procedure report does not answer your questions, please call your gastroenterologist to clarify.  If you requested that your care partner not be given the details of your procedure findings, then the procedure report has been included in a sealed envelope for you to review at your convenience later.  YOU SHOULD EXPECT: Some feelings of bloating in the abdomen. Passage of more gas than usual.  Walking can help get rid of the air that was put into your GI tract during the procedure and reduce the bloating. If you had a lower endoscopy (such as a colonoscopy or flexible sigmoidoscopy) you may notice spotting of blood in your stool or on the toilet paper. If you underwent a bowel prep for your procedure, you may not have a normal bowel movement for a few days.  Please Note:  You might notice some irritation and congestion in your nose or some drainage.  This is from the oxygen used during your procedure.  There is no need for concern and it should clear up in a day or so.  SYMPTOMS TO REPORT IMMEDIATELY:   Following lower endoscopy (colonoscopy or flexible sigmoidoscopy):  Excessive amounts of blood in the stool  Significant tenderness or worsening of abdominal pains  Swelling of the abdomen that is new, acute  Fever of 100F or higher   For urgent or emergent issues, a gastroenterologist can be reached at any hour by calling [phone].   DIET:  We do recommend a small meal at first, but then you may proceed to your regular diet.  Drink plenty of fluids but you should avoid alcoholic beverages for 24 hours.  ACTIVITY:  You should plan to take it easy for the rest of today and you should NOT DRIVE or use heavy machinery until tomorrow (because of the sedation  medicines used during the test).    FOLLOW UP: Our staff will call the number listed on your records 48-72 hours following your procedure to check on you and address any questions or concerns that you may have regarding the information given to you following your procedure. If we do not reach you, we will leave a message.  We will attempt to reach you two times.  During this call, we will ask if you have developed any symptoms of COVID 19. If you develop any symptoms (ie: fever, flu-like symptoms, shortness of breath, cough etc.) before then, please call [phone].  If you test positive for Covid 19 in the 2 weeks post procedure, please call and report this information to [place].    If any biopsies were taken you will be contacted by phone or by letter within the next 1-3 weeks.  Please call us at [phone] if you have not heard about the biopsies in 3 weeks.    SIGNATURES/CONFIDENTIALITY: You and/or your care partner have signed paperwork which will be entered into your electronic medical record.  These signatures attest to the fact that that the information above on your After Visit Summary has been reviewed and is understood.  Full responsibility of the confidentiality of this discharge information lies with you and/or your care-partner.

## 2019-08-07 ENCOUNTER — Telehealth: Payer: Self-pay

## 2019-08-07 NOTE — Telephone Encounter (Signed)
  Follow up Call-  Call back number 08/05/2019  Post procedure Call Back phone  # [phone]  Permission to leave phone message Yes  Some recent data might be hidden     Patient questions:  Do you have a fever, pain , or abdominal swelling? No. Pain Score  0 *  Have you tolerated food without any problems? Yes.    Have you been able to return to your normal activities? Yes.    Do you have any questions about your discharge instructions: Diet   No. Medications  No. Follow up visit  No.  Do you have questions or concerns about your Care? No.  Actions: * If pain score is 4 or above: 1. No action needed, pain <4.Have you developed a fever since your procedure? no  2.   Have you had an respiratory symptoms (SOB or cough) since your procedure? no  3.   Have you tested positive for COVID 19 since your procedure no  4.   Have you had any family members/close contacts diagnosed with the COVID 19 since your procedure?  no   If yes to any of these questions please route to Joylene John, RN and Alphonsa Gin, Therapist, sports.

## 2019-08-25 ENCOUNTER — Encounter: Payer: Self-pay | Admitting: Gastroenterology

## 2019-09-06 ENCOUNTER — Ambulatory Visit: Payer: Medicare Other

## 2019-10-01 DIAGNOSIS — H43813 Vitreous degeneration, bilateral: Secondary | ICD-10-CM | POA: Diagnosis not present

## 2019-10-01 DIAGNOSIS — H35432 Paving stone degeneration of retina, left eye: Secondary | ICD-10-CM | POA: Diagnosis not present

## 2019-10-01 DIAGNOSIS — H5213 Myopia, bilateral: Secondary | ICD-10-CM | POA: Diagnosis not present

## 2019-10-01 DIAGNOSIS — H04123 Dry eye syndrome of bilateral lacrimal glands: Secondary | ICD-10-CM | POA: Diagnosis not present

## 2019-10-01 DIAGNOSIS — H2513 Age-related nuclear cataract, bilateral: Secondary | ICD-10-CM | POA: Diagnosis not present

## 2019-10-09 NOTE — Telephone Encounter (Signed)
Done

## 2019-11-24 DIAGNOSIS — M79645 Pain in left finger(s): Secondary | ICD-10-CM | POA: Diagnosis not present

## 2019-11-24 DIAGNOSIS — M6740 Ganglion, unspecified site: Secondary | ICD-10-CM | POA: Diagnosis not present

## 2020-02-09 ENCOUNTER — Other Ambulatory Visit: Payer: Self-pay

## 2020-02-09 ENCOUNTER — Ambulatory Visit (INDEPENDENT_AMBULATORY_CARE_PROVIDER_SITE_OTHER): Payer: Medicare Other | Admitting: *Deleted

## 2020-02-09 DIAGNOSIS — Z0000 Encounter for general adult medical examination without abnormal findings: Secondary | ICD-10-CM | POA: Diagnosis not present

## 2020-02-09 NOTE — Progress Notes (Signed)
Virtual Visit via Audio Note  I connected with patient on 02/09/20 at  1:15 PM EST by audio enabled telemedicine application and verified that I am speaking with the correct person using two identifiers.   THIS ENCOUNTER IS A VIRTUAL VISIT DUE TO COVID-19 - PATIENT WAS NOT SEEN IN THE OFFICE. PATIENT HAS CONSENTED TO VIRTUAL VISIT / TELEMEDICINE VISIT   Location of patient: home  Location of provider: office  I discussed the limitations of evaluation and management by telemedicine and the availability of in person appointments. The patient expressed understanding and agreed to proceed.   Subjective:   Otilia Kareem is a 74 y.o. female who presents for Medicare Annual (Subsequent) preventive examination.  Review of Systems:  Home Safety/Smoke Alarms: Feels safe in home. Smoke alarms in place.  Lives in 2 story home. No need or use of DME at this time.    Female:   Mammo- 06/03/19.      Dexa scan- due 04/2020       CCS- 08/05/19. Recall 5 yrs.     Objective:     Vitals: Unable to assess. This visit is enabled though telemedicine due to Covid 19.   Advanced Directives 02/09/2020 02/05/2019 01/29/2018  Does Patient Have a Medical Advance Directive? Yes Yes Yes  Type of Paramedic of [place];Living will [place];Living will -  Does patient want to make changes to medical advance directive? No - Patient declined No - Patient declined -  Copy of [place] in Chart? Yes - validated most recent copy scanned in chart (See row information) No - copy requested -    Tobacco Social History   Tobacco Use  Smoking Status Never Smoker  Smokeless Tobacco Never Used     Counseling given: Not Answered   Clinical Intake: Pain : No/denies pain     Past Medical History:  Diagnosis Date  . Allergy   . Asthma    as child  . Depression   . GERD (gastroesophageal reflux disease)   . Ulcerative colitis ([place])   . UTI (urinary  tract infection)    Past Surgical History:  Procedure Laterality Date  . bone spur left pinky finger under local  07/30/2019  . COLONOSCOPY  04/01/2014, 2013   2015 normal exam,  in [place]  . TONSILLECTOMY Bilateral 1950's   Family History  Problem Relation Age of Onset  . Diabetes Mother        had MI late 79's  . Colon polyps Mother   . COPD Father        smoker, but stopped in teh 64  . Colon polyps Father   . Colon cancer Paternal Grandfather   . Arthritis Sister   . Diabetes Maternal Grandmother   . Cancer Maternal Grandfather   . Esophageal cancer Neg Hx   . Rectal cancer Neg Hx   . Stomach cancer Neg Hx    Social History   Socioeconomic History  . Marital status: Married    Spouse name: Not on file  . Number of children: 1  . Years of education: Not on file  . Highest education level: Not on file  Occupational History  . Not on file  Tobacco Use  . Smoking status: Never Smoker  . Smokeless tobacco: Never Used  Substance and Sexual Activity  . Alcohol use: Yes    Alcohol/week: 14.0 standard drinks    Types: 14 Glasses of wine per week    Comment:  2 glasses of wine per day;   . Drug use: No  . Sexual activity: Yes    Partners: Male  Other Topics Concern  . 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   Enjoys reading and singing in choral groups   Social Determinants of Health   Financial Resource Strain:   . Difficulty of Paying Living Expenses: Not on file  Food Insecurity:   . Worried About Charity fundraiser in the Last Year: Not on file  . Ran Out of Food in the Last Year: Not on file  Transportation Needs:   . Lack of Transportation (Medical): Not on file  . Lack of Transportation (Non-Medical): Not on file  Physical Activity:   . Days of Exercise per Week: Not on file  . Minutes  of Exercise per Session: Not on file  Stress:   . Feeling of Stress : Not on file  Social Connections:   . Frequency of Communication with Friends and Family: Not on file  . Frequency of Social Gatherings with Friends and Family: Not on file  . Attends Religious Services: Not on file  . Active Member of Clubs or Organizations: Not on file  . Attends Archivist Meetings: Not on file  . Marital Status: Not on file    Outpatient Encounter Medications as of 02/09/2020  Medication Sig  . aspirin EC 81 MG tablet Take 1 tablet (81 mg total) by mouth daily. (Patient taking differently: Take 81 mg by mouth 3 (three) times a week. )  . B Complex-C (SUPER B COMPLEX PO) Take 1 tablet by mouth daily.  . calcium carbonate 200 MG capsule Take 1,600 mg by mouth daily.   . clindamycin (CLINDAGEL) 1 % gel Apply topically 2 (two) times daily.  . Glucosamine-Chondroitin 750-600 MG TABS Take 1 tablet by mouth daily.  . Lactobacillus (ACIDOPHILUS) 0.5 MG TABS Take by mouth.  . metroNIDAZOLE (METROGEL) 1 % gel Apply topically daily.  . Multiple Vitamin (MULTIVITAMIN) tablet Take 1 tablet by mouth daily.  . raloxifene (EVISTA) 60 MG tablet Take 60 mg by mouth daily. Prescribed by Dr. Marina Gravel, GYN  . tretinoin (RETIN-A) 0.025 % gel Apply topically as needed.  . venlafaxine XR (EFFEXOR-XR) 37.5 MG 24 hr capsule Take 1 capsule PO three times weekly.  Marland Kitchen VITAMIN D PO Take 1,600 mg by mouth daily.  Marland Kitchen omeprazole (PRILOSEC) 20 MG capsule Take 20 mg by mouth daily.  . [DISCONTINUED] cephALEXin (KEFLEX) 500 MG capsule Take 1 capsule (500 mg total) by mouth 4 (four) times daily as needed. For acne prn (Patient not taking: Reported on 08/05/2019)  . [DISCONTINUED] loratadine (CLARITIN) 10 MG tablet Take 10 mg by mouth daily.   No facility-administered encounter medications on file as of 02/09/2020.    Activities of Daily Living In your present state of health, do you have any difficulty performing the  following activities: 02/09/2020  Hearing? N  Vision? N  Difficulty concentrating or making decisions? N  Walking or climbing stairs? N  Dressing or bathing? N  Doing errands, shopping? N  Preparing Food and eating ? N  Using the Toilet? N  In the past six months, have you accidently leaked urine? N  Do you have problems with loss of bowel control? N  Managing your Medications? N  Managing your Finances? N  Housekeeping or managing your Housekeeping? N  Some recent data might be hidden    Patient Care Team: Caren Macadam, MD as PCP - General (Family Medicine) Marylynn Pearson, MD as Consulting Physician (Obstetrics and Gynecology) Druscilla Brownie, MD as Referring Physician (Dermatology)    Assessment:   This is a routine wellness examination for Kennya. Physical assessment deferred to PCP.  Exercise Activities and Dietary recommendations Current Exercise Habits: The patient does not participate in regular exercise at present, Exercise limited by: None identified Diet (meal preparation, eat out, water intake, caffeinated beverages, dairy products, fruits and vegetables): well balanced     Goals    . patient     Will consider exercise routine and establish something that works for you long term     . Patient Stated     Take time to design your exercise program     . Patient Stated     Develop more muscle tone, make healthier food choices.       Fall Risk Fall Risk  02/09/2020 07/02/2019 02/05/2019 01/29/2018 03/30/2017  Falls in the past year? 0 0 0 No No  Number falls in past yr: 0 0 - - -  Injury with Fall? 0 0 - - -  Risk for fall due to : - - History of fall(s) - -  Follow up Education provided;Falls prevention discussed - Falls prevention discussed - -   Depression Screen PHQ 2/9 Scores 02/09/2020 02/05/2019 01/29/2018 03/30/2017  PHQ - 2 Score 0 0 0 1  PHQ- 9 Score - 0 - -     Cognitive Function Ad8 score reviewed for issues:  Issues making  decisions:no  Less interest in hobbies / activities:no  Repeats questions, stories (family complaining):no  Trouble using ordinary gadgets (microwave, computer, phone):no  Forgets the month or year: no  Mismanaging finances: no  Remembering appts:no  Daily problems with thinking and/or memory:no Ad8 score is=0   MMSE - Mini Mental State Exam 11/23/2016  Not completed: (No Data)        Immunization History  Administered Date(s) Administered  . Influenza, High Dose Seasonal PF 10/08/2014, 10/02/2016, 10/08/2017, 09/19/2018  . Influenza-Unspecified 09/17/2017  . Pneumococcal Conjugate-13 05/07/2014  . Pneumococcal Polysaccharide-23 03/30/2017  . Td 12/23/2007  . Zoster 12/02/2009  . Zoster Recombinat (Shingrix) 05/27/2018, 07/30/2018     Screening Tests Health Maintenance  Topic Date Due  . TETANUS/TDAP  12/22/2017  . INFLUENZA VACCINE  07/19/2019  . MAMMOGRAM  06/02/2020  . COLONOSCOPY  08/04/2024  . DEXA SCAN  Completed  . Hepatitis C Screening  Completed  . PNA vac Low Risk Adult  Completed      Plan:    Please schedule your next medicare wellness visit with me in 1 yr.  Continue to eat heart healthy diet (full of fruits, vegetables, whole grains, lean protein, water--limit salt, fat, and sugar intake) and increase physical activity as tolerated.  Continue doing brain stimulating activities (puzzles, reading, adult coloring books, staying active) to keep memory sharp.     I have personally reviewed and noted the following in the patient's chart:   . Medical and social history . Use of alcohol, tobacco or illicit drugs  . Current medications and supplements . Functional ability and status . Nutritional status . Physical activity . Advanced directives . List of other physicians . Hospitalizations, surgeries, and ER visits in previous 12 months . Vitals . Screenings to include cognitive, depression, and falls .  Referrals and appointments  In  addition, I have reviewed and discussed with patient certain preventive protocols, quality metrics, and best practice recommendations. A written personalized care plan for preventive services as well as general preventive health recommendations were provided to patient.     Naaman Plummer [place], [place]  02/09/2020

## 2020-02-09 NOTE — Patient Instructions (Signed)
Please schedule your next medicare wellness visit with me in 1 yr.  Continue to eat heart healthy diet (full of fruits, vegetables, whole grains, lean protein, water--limit salt, fat, and sugar intake) and increase physical activity as tolerated.  Continue doing brain stimulating activities (puzzles, reading, adult coloring books, staying active) to keep memory sharp.     Whitney Beasley , Thank you for taking time to come for your Medicare Wellness Visit. I appreciate your ongoing commitment to your health goals. Please review the following plan we discussed and let me know if I can assist you in the future.   These are the goals we discussed: Goals    . patient     Will consider exercise routine and establish something that works for you long term     . Patient Stated     Take time to design your exercise program     . Patient Stated     Develop more muscle tone, make healthier food choices.       This is a list of the screening recommended for you and due dates:  Health Maintenance  Topic Date Due  . Tetanus Vaccine  12/22/2017  . Flu Shot  07/19/2019  . Mammogram  06/02/2020  . Colon Cancer Screening  08/04/2024  . DEXA scan (bone density measurement)  Completed  .  Hepatitis C: One time screening is recommended by Center for Disease Control  (CDC) for  adults born from [birth year] through 1963.   Completed  . Pneumonia vaccines  Completed    Preventive Care 65 Years and Older, Female Preventive care refers to lifestyle choices and visits with your health care provider that can promote health and wellness. This includes:  A yearly physical exam. This is also called an annual well check.  Regular dental and eye exams.  Immunizations.  Screening for certain conditions.  Healthy lifestyle choices, such as diet and exercise. What can I expect for my preventive care visit? Physical exam Your health care provider will check:  Height and weight. These may be used to calculate  body mass index (BMI), which is a measurement that tells if you are at a healthy weight.  Heart rate and blood pressure.  Your skin for abnormal spots. Counseling Your health care provider may ask you questions about:  Alcohol, tobacco, and drug use.  Emotional well-being.  Home and relationship well-being.  Sexual activity.  Eating habits.  History of falls.  Memory and ability to understand (cognition).  Work and work Statistician.  Pregnancy and menstrual history. What immunizations do I need?  Influenza (flu) vaccine  This is recommended every year. Tetanus, diphtheria, and pertussis (Tdap) vaccine  You may need a Td booster every 10 years. Varicella (chickenpox) vaccine  You may need this vaccine if you have not already been vaccinated. Zoster (shingles) vaccine  You may need this after age 3. Pneumococcal conjugate (PCV13) vaccine  One dose is recommended after age 50. Pneumococcal polysaccharide (PPSV23) vaccine  One dose is recommended after age 17. Measles, mumps, and rubella (MMR) vaccine  You may need at least one dose of MMR if you were born in 1957 or later. You may also need a second dose. Meningococcal conjugate (MenACWY) vaccine  You may need this if you have certain conditions. Hepatitis A vaccine  You may need this if you have certain conditions or if you travel or work in places where you may be exposed to hepatitis A. Hepatitis B vaccine  You may  need this if you have certain conditions or if you travel or work in places where you may be exposed to hepatitis B. Haemophilus influenzae type b (Hib) vaccine  You may need this if you have certain conditions. You may receive vaccines as individual doses or as more than one vaccine together in one shot (combination vaccines). Talk with your health care provider about the risks and benefits of combination vaccines. What tests do I need? Blood tests  Lipid and cholesterol levels. These may  be checked every 5 years, or more frequently depending on your overall health.  Hepatitis C test.  Hepatitis B test. Screening  Lung cancer screening. You may have this screening every year starting at age 80 if you have a 30-pack-year history of smoking and currently smoke or have quit within the past 15 years.  Colorectal cancer screening. All adults should have this screening starting at age 3 and continuing until age 41. Your health care provider may recommend screening at age 20 if you are at increased risk. You will have tests every 1-10 years, depending on your results and the type of screening test.  Diabetes screening. This is done by checking your blood sugar (glucose) after you have not eaten for a while (fasting). You may have this done every 1-3 years.  Mammogram. This may be done every 1-2 years. Talk with your health care provider about how often you should have regular mammograms.  BRCA-related cancer screening. This may be done if you have a family history of breast, ovarian, tubal, or peritoneal cancers. Other tests  Sexually transmitted disease (STD) testing.  Bone density scan. This is done to screen for osteoporosis. You may have this done starting at age 10. Follow these instructions at home: Eating and drinking  Eat a diet that includes fresh fruits and vegetables, whole grains, lean protein, and low-fat dairy products. Limit your intake of foods with high amounts of sugar, saturated fats, and salt.  Take vitamin and mineral supplements as recommended by your health care provider.  Do not drink alcohol if your health care provider tells you not to drink.  If you drink alcohol: ? Limit how much you have to 0-1 drink a day. ? Be aware of how much alcohol is in your drink. In the [place] one drink equals one 12 oz bottle of beer (355 mL), one 5 oz glass of wine (148 mL), or one 1 oz glass of hard liquor (44 mL). Lifestyle  Take daily care of your teeth and  gums.  Stay active. Exercise for at least 30 minutes on 5 or more days each week.  Do not use any products that contain nicotine or tobacco, such as cigarettes, e-cigarettes, and chewing tobacco. If you need help quitting, ask your health care provider.  If you are sexually active, practice safe sex. Use a condom or other form of protection in order to prevent STIs (sexually transmitted infections).  Talk with your health care provider about taking a low-dose aspirin or statin. What's next?  Go to your health care provider once a year for a well check visit.  Ask your health care provider how often you should have your eyes and teeth checked.  Stay up to date on all vaccines. This information is not intended to replace advice given to you by your health care provider. Make sure you discuss any questions you have with your health care provider. Document Revised: 11/28/2018 Document Reviewed: 11/28/2018 Elsevier Patient Education  2020 [employer].

## 2020-04-19 ENCOUNTER — Other Ambulatory Visit: Payer: Self-pay | Admitting: Family Medicine

## 2020-04-19 DIAGNOSIS — Z1231 Encounter for screening mammogram for malignant neoplasm of breast: Secondary | ICD-10-CM

## 2020-05-25 DIAGNOSIS — M8588 Other specified disorders of bone density and structure, other site: Secondary | ICD-10-CM | POA: Diagnosis not present

## 2020-06-08 ENCOUNTER — Other Ambulatory Visit: Payer: Self-pay

## 2020-06-08 ENCOUNTER — Ambulatory Visit
Admission: RE | Admit: 2020-06-08 | Discharge: 2020-06-08 | Disposition: A | Discharge status: Home / Self Care | Payer: Medicare Other | Source: Ambulatory Visit

## 2020-06-08 DIAGNOSIS — Z1231 Encounter for screening mammogram for malignant neoplasm of breast: Secondary | ICD-10-CM

## 2020-06-24 DIAGNOSIS — D2272 Melanocytic nevi of left lower limb, including hip: Secondary | ICD-10-CM | POA: Diagnosis not present

## 2020-06-24 DIAGNOSIS — L814 Other melanin hyperpigmentation: Secondary | ICD-10-CM | POA: Diagnosis not present

## 2020-06-24 DIAGNOSIS — D485 Neoplasm of uncertain behavior of skin: Secondary | ICD-10-CM | POA: Diagnosis not present

## 2020-06-24 DIAGNOSIS — D2261 Melanocytic nevi of right upper limb, including shoulder: Secondary | ICD-10-CM | POA: Diagnosis not present

## 2020-06-24 DIAGNOSIS — L700 Acne vulgaris: Secondary | ICD-10-CM | POA: Diagnosis not present

## 2020-06-24 DIAGNOSIS — L570 Actinic keratosis: Secondary | ICD-10-CM | POA: Diagnosis not present

## 2020-06-24 DIAGNOSIS — D225 Melanocytic nevi of trunk: Secondary | ICD-10-CM | POA: Diagnosis not present

## 2020-08-05 DIAGNOSIS — L570 Actinic keratosis: Secondary | ICD-10-CM | POA: Diagnosis not present

## 2020-09-04 ENCOUNTER — Other Ambulatory Visit: Payer: Self-pay

## 2020-09-04 MED ORDER — CEPHALEXIN 500 MG PO CAPS: 500.0000 mg | ORAL_CAPSULE | Freq: Two times a day (BID) | ORAL | 0 refills | Status: DC

## 2020-09-04 NOTE — Addendum Note (Signed)
Addended by: Billey Chang on: 09/04/2020 08:58 AM   Modules accepted: Orders

## 2020-09-04 NOTE — Telephone Encounter (Signed)
I have ordered an antiobiotic to cover for infection of the skin at the tick bite site; however, if she develops other sxs, she will need to be evaluated at an UC; she is allergic to minocycline so cannot order doxycycline to cover for any tick borne infections.   Please send to her preferred pharmacy. Need information. Med is pended.

## 2020-09-04 NOTE — Addendum Note (Signed)
Addended by: Doran Clay A on: 09/04/2020 09:30 AM   Modules accepted: Orders

## 2020-09-04 NOTE — Telephone Encounter (Signed)
Patient transferred from [facility]. States she removed a tick last night and the area on her leg is red, swollen, and producing heat. States the tick was easily removed, does not believe to have any remains of the tick at the bite site. Requesting an antibiotic be sent in. She is on vacation in [place] this weekend.   [phone]

## 2020-09-06 ENCOUNTER — Telehealth: Payer: Self-pay | Admitting: *Deleted

## 2020-09-06 NOTE — Telephone Encounter (Signed)
Patient called after hours line on 09/04/2020. Patient reports she had a tick bite, site looks like mosquito bite, and wants antibiotic. She thinks tick was on her for a couple of days. The tick was really easy to remove and pretty swollen.  Patient was referred to Saturday clinic but reports.  the phone number listed and it wasn't working. I advised virtual visits start at 9am. I advised for her to call back then. If that doesn't work, then she is to proceed to the nearest [facility]. Patient verbalized understanding.

## 2020-09-07 ENCOUNTER — Other Ambulatory Visit: Payer: Self-pay | Admitting: Family Medicine

## 2020-09-07 DIAGNOSIS — F4323 Adjustment disorder with mixed anxiety and depressed mood: Secondary | ICD-10-CM

## 2020-10-04 DIAGNOSIS — H25813 Combined forms of age-related cataract, bilateral: Secondary | ICD-10-CM | POA: Diagnosis not present

## 2020-10-04 DIAGNOSIS — H43813 Vitreous degeneration, bilateral: Secondary | ICD-10-CM | POA: Diagnosis not present

## 2020-10-04 DIAGNOSIS — H5213 Myopia, bilateral: Secondary | ICD-10-CM | POA: Diagnosis not present

## 2020-10-04 DIAGNOSIS — H35432 Paving stone degeneration of retina, left eye: Secondary | ICD-10-CM | POA: Diagnosis not present

## 2020-10-04 DIAGNOSIS — H04123 Dry eye syndrome of bilateral lacrimal glands: Secondary | ICD-10-CM | POA: Diagnosis not present

## 2020-10-18 ENCOUNTER — Encounter: Payer: Self-pay | Admitting: Family Medicine

## 2020-10-20 NOTE — Telephone Encounter (Signed)
Patient informed of the message below.  I offered an appt today with Dr Sarajane Jews to arrive at 2:30pm as PCP does not have any openings and she stated she is out of town and will return next Thursday.  Patient requested an appt Thursday and I informed her there are no openings available.  Patient stated she will watch the area, go to an urgent care where she is located if needed or call back next week if needed.

## 2020-11-04 ENCOUNTER — Other Ambulatory Visit: Payer: Self-pay | Admitting: Family Medicine

## 2020-11-04 DIAGNOSIS — F4323 Adjustment disorder with mixed anxiety and depressed mood: Secondary | ICD-10-CM

## 2020-12-21 ENCOUNTER — Telehealth: Payer: Self-pay | Admitting: Family Medicine

## 2020-12-21 DIAGNOSIS — F4323 Adjustment disorder with mixed anxiety and depressed mood: Secondary | ICD-10-CM

## 2020-12-21 MED ORDER — VENLAFAXINE HCL ER 37.5 MG PO CP24: ORAL_CAPSULE | ORAL | 0 refills | Status: DC

## 2020-12-21 NOTE — Telephone Encounter (Signed)
Rx done. 

## 2020-12-21 NOTE — Telephone Encounter (Signed)
Patient is calling and requesting a refill for venlafaxine XR (EFFEXOR-XR) 37.5 MG 24 hr capsule 90 day supply sent to  [facility], [place], [place] [id number]  Phone:  [phone] Fax:  [phone] CB is [phone]

## 2021-02-16 ENCOUNTER — Telehealth: Payer: Self-pay | Admitting: Family Medicine

## 2021-02-16 NOTE — Telephone Encounter (Signed)
Left message for patient to call back and schedule Medicare Annual Wellness Visit (AWV) either virtually or in office. No detailed message left   Last AWV 02/09/20 please schedule at anytime with [facility] Nurse Health Advisor 1 or 2   This should be a 45 minute visit.

## 2021-02-17 ENCOUNTER — Other Ambulatory Visit: Payer: Self-pay | Admitting: Family Medicine

## 2021-02-17 DIAGNOSIS — F4323 Adjustment disorder with mixed anxiety and depressed mood: Secondary | ICD-10-CM

## 2021-03-22 ENCOUNTER — Ambulatory Visit: Payer: Medicare Other

## 2021-03-28 ENCOUNTER — Ambulatory Visit: Payer: Medicare Other

## 2021-04-11 ENCOUNTER — Ambulatory Visit (INDEPENDENT_AMBULATORY_CARE_PROVIDER_SITE_OTHER): Payer: Medicare Other

## 2021-04-11 DIAGNOSIS — Z1231 Encounter for screening mammogram for malignant neoplasm of breast: Secondary | ICD-10-CM

## 2021-04-11 DIAGNOSIS — Z0000 Encounter for general adult medical examination without abnormal findings: Secondary | ICD-10-CM

## 2021-04-11 NOTE — Progress Notes (Signed)
Subjective:   Whitney Beasley is a 75 y.o. female who presents for Medicare Annual (Subsequent) preventive examination.  I connected with Whitney Beasley today by telephone and verified that I am speaking with the correct person using two identifiers. Location patient: home Location provider: work Persons participating in the virtual visit: patient, provider.   I discussed the limitations, risks, security and privacy concerns of performing an evaluation and management service by telephone and the availability of in person appointments. I also discussed with the patient that there may be a patient responsible charge related to this service. The patient expressed understanding and verbally consented to this telephonic visit.    Interactive audio and video telecommunications were attempted between this provider and patient, however failed, due to patient having technical difficulties OR patient did not have access to video capability.  We continued and completed visit with audio only.     Review of Systems    n/a       Objective:    There were no vitals filed for this visit. There is no height or weight on file to calculate BMI.  Advanced Directives 02/09/2020 02/05/2019 01/29/2018  Does Patient Have a Medical Advance Directive? Yes Yes Yes  Type of Paramedic of [place];Living will [place];Living will -  Does patient want to make changes to medical advance directive? No - Patient declined No - Patient declined -  Copy of [place] in Chart? Yes - validated most recent copy scanned in chart (See row information) No - copy requested -  d  Current Medications (verified) Outpatient Encounter Medications as of 04/11/2021  Medication Sig  . aspirin EC 81 MG tablet Take 1 tablet (81 mg total) by mouth daily. (Patient taking differently: Take 81 mg by mouth 3 (three) times a week. )  . B Complex-C (SUPER B COMPLEX PO) Take 1 tablet  by mouth daily.  . calcium carbonate 200 MG capsule Take 1,600 mg by mouth daily.   . cephALEXin (KEFLEX) 500 MG capsule Take 1 capsule (500 mg total) by mouth 2 (two) times daily.  . clindamycin (CLINDAGEL) 1 % gel Apply topically 2 (two) times daily.  . Glucosamine-Chondroitin 750-600 MG TABS Take 1 tablet by mouth daily.  . Lactobacillus (ACIDOPHILUS) 0.5 MG TABS Take by mouth.  . metroNIDAZOLE (METROGEL) 1 % gel Apply topically daily.  . Multiple Vitamin (MULTIVITAMIN) tablet Take 1 tablet by mouth daily.  Marland Kitchen omeprazole (PRILOSEC) 20 MG capsule Take 20 mg by mouth daily.  . raloxifene (EVISTA) 60 MG tablet Take 60 mg by mouth daily. Prescribed by Dr. Marina Gravel, GYN  . tretinoin (RETIN-A) 0.025 % gel Apply topically as needed.  . venlafaxine XR (EFFEXOR-XR) 37.5 MG 24 hr capsule TAKE 1 CAPSULE BY MOUTH 3  TIMES WEEKLY  . VITAMIN D PO Take 1,600 mg by mouth daily.   No facility-administered encounter medications on file as of 04/11/2021.    Allergies (verified) Minocycline   History: Past Medical History:  Diagnosis Date  . Allergy   . Asthma    as child  . Depression   . GERD (gastroesophageal reflux disease)   . Ulcerative colitis ([place])   . UTI (urinary tract infection)    Past Surgical History:  Procedure Laterality Date  . bone spur left pinky finger under local  07/30/2019  . COLONOSCOPY  04/01/2014, 2013   2015 normal exam,  in [place]  . TONSILLECTOMY Bilateral 1950's   Family History  Problem Relation Age of Onset  . Diabetes Mother        had MI late 59's  . Colon polyps Mother   . COPD Father        smoker, but stopped in teh 43  . Colon polyps Father   . Colon cancer Paternal Grandfather   . Arthritis Sister   . Diabetes Maternal Grandmother   . Cancer Maternal Grandfather   . Esophageal cancer Neg Hx   . Rectal cancer Neg Hx   . Stomach cancer Neg Hx    Social History   Socioeconomic History  . Marital status: Married    Spouse name: Not on  file  . Number of children: 1  . Years of education: Not on file  . Highest education level: Not on file  Occupational History  . Not on file  Tobacco Use  . Smoking status: Never Smoker  . Smokeless tobacco: Never Used  Vaping Use  . Vaping Use: Never used  Substance and Sexual Activity  . Alcohol use: Yes    Alcohol/week: 14.0 standard drinks    Types: 14 Glasses of wine per week    Comment: 2 glasses of wine per day;   . Drug use: No  . Sexual activity: Yes    Partners: Male  Other Topics Concern  . 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   Enjoys reading and singing in choral groups   Social Determinants of Health   Financial Resource Strain: Not on file  Food Insecurity: Not on file  Transportation Needs: Not on file  Physical Activity: Not on file  Stress: Not on file  Social Connections: Not on file    Tobacco Counseling Counseling given: Not Answered   Clinical Intake:                 Diabetic?no         Activities of Daily Living No flowsheet data found.  Patient Care Team: Caren Macadam, MD as PCP - General (Family Medicine) Marylynn Pearson, MD as Consulting Physician (Obstetrics and Gynecology) Druscilla Brownie, MD as Referring Physician (Dermatology)  Indicate any recent Medical Services you may have received from other than [facility] providers in the past year (date may be approximate).     Assessment:   This is a routine wellness examination for Whitney Beasley.  Hearing/Vision screen No exam data present  Dietary issues and exercise activities discussed:    Goals    . patient     Will consider exercise routine and establish something that works for you long term     . Patient Stated     Take time to design your exercise program     . Patient Stated     Develop  more muscle tone, make healthier food choices.      Depression Screen PHQ 2/9 Scores 02/09/2020 02/05/2019 01/29/2018 03/30/2017 11/23/2016 12/31/2014  PHQ - 2 Score 0 0 0 1 0 0  PHQ- 9 Score - 0 - - - -    Fall Risk Fall Risk  02/09/2020 07/02/2019 02/05/2019 01/29/2018 03/30/2017  Falls in the past year? 0 0 0 No No  Number falls in past yr: 0 0 - - -  Injury with Fall? 0 0 - - -  Risk for fall due to : - -  History of fall(s) - -  Follow up Education provided;Falls prevention discussed - Falls prevention discussed - -    FALL RISK PREVENTION PERTAINING TO THE HOME:  Any stairs in or around the home? Yes  If so, are there any without handrails? Yes  Home free of loose throw rugs in walkways, pet beds, electrical cords, etc? Yes  Adequate lighting in your home to reduce risk of falls? Yes   ASSISTIVE DEVICES UTILIZED TO PREVENT FALLS:  Life alert? No  Use of a cane, walker or w/c? No  Grab bars in the bathroom? Yes  Shower chair or bench in shower? Yes  Elevated toilet seat or a handicapped toilet? Yes    Cognitive Function: Normal cognitive status assessed by direct observation by this Nurse Health Advisor. No abnormalities found.   MMSE - Mini Mental State Exam 11/23/2016  Not completed: (No Data)        Immunizations Immunization History  Administered Date(s) Administered  . Influenza, High Dose Seasonal PF 10/08/2014, 10/02/2016, 10/08/2017, 09/19/2018  . Influenza-Unspecified 09/17/2017  . Pneumococcal Conjugate-13 05/07/2014  . Pneumococcal Polysaccharide-23 03/30/2017  . Td 12/23/2007  . Zoster 12/02/2009  . Zoster Recombinat (Shingrix) 05/27/2018, 07/30/2018    TDAP status: Due, Education has been provided regarding the importance of this vaccine. Advised may receive this vaccine at local pharmacy or Health Dept. Aware to provide a copy of the vaccination record if obtained from local pharmacy or Health Dept. Verbalized acceptance and understanding.  Flu Vaccine  status: Up to date  Pneumococcal vaccine status: Up to date  Covid-19 vaccine status: Completed vaccines  Qualifies for Shingles Vaccine? Yes   Zostavax completed Yes   Shingrix Completed?: Yes  Screening Tests Health Maintenance  Topic Date Due  . COVID-19 Vaccine (1) Never done  . TETANUS/TDAP  12/22/2017  . MAMMOGRAM  06/08/2021  . INFLUENZA VACCINE  07/18/2021  . COLONOSCOPY (Pts 45-70yr Insurance coverage will need to be confirmed)  08/04/2024  . DEXA SCAN  Completed  . Hepatitis C Screening  Completed  . PNA vac Low Risk Adult  Completed  . HPV VACCINES  Aged Out    Health Maintenance  Health Maintenance Due  Topic Date Due  . COVID-19 Vaccine (1) Never done  . TETANUS/TDAP  12/22/2017    Colorectal cancer screening: Type of screening: Colonoscopy. Completed 08/05/2019. Repeat every 5 years  Mammogram status: Ordered 04/11/2021. Pt provided with contact info and advised to call to schedule appt.   Bone Density status: Completed 05/08/2018. Results reflect: Bone density results: OSTEOPENIA. Repeat every 5 years.  Lung Cancer Screening: (Low Dose CT Chest recommended if Age 862-80years, 30 pack-year currently smoking OR have quit w/in 15years.) does not qualify.   Lung Cancer Screening Referral: n/a  Additional Screening:  Hepatitis C Screening: does not qualify; Completed 04/15/2018  Vision Screening: Recommended annual ophthalmology exams for early detection of glaucoma and other disorders of the eye. Is the patient up to date with their annual eye exam?  Yes  Who is the provider or what is the name of the office in which the patient attends annual eye exams? Dr.Grote  If pt is not established with a provider, would they like to be referred to a provider to establish care? No .   Dental Screening: Recommended annual dental exams for proper oral hygiene  Community Resource Referral / Chronic Care Management: CRR required this visit?  No   CCM required  this visit?  No  Plan:     I have personally reviewed and noted the following in the patient's chart:   . Medical and social history . Use of alcohol, tobacco or illicit drugs  . Current medications and supplements . Functional ability and status . Nutritional status . Physical activity . Advanced directives . List of other physicians . Hospitalizations, surgeries, and ER visits in previous 12 months . Vitals . Screenings to include cognitive, depression, and falls . Referrals and appointments  In addition, I have reviewed and discussed with patient certain preventive protocols, quality metrics, and best practice recommendations. A written personalized care plan for preventive services as well as general preventive health recommendations were provided to patient.     Randel Pigg, LPN   5/80/9983   Nurse Notes: none

## 2021-04-11 NOTE — Patient Instructions (Signed)
Ms. Whitney Beasley , Thank you for taking time to come for your Medicare Wellness Visit. I appreciate your ongoing commitment to your health goals. Please review the following plan we discussed and let me know if I can assist you in the future.   Screening recommendations/referrals: Colonoscopy: current Due 08/04/2024 Mammogram: referral 04/11/2021 Bone Density: current 05/08/2018 Recommended yearly ophthalmology/optometry visit for glaucoma screening and checkup Recommended yearly dental visit for hygiene and checkup  Vaccinations: Influenza vaccine:  Current due fall 2022 Pneumococcal vaccine: completed series  Tdap vaccine: due upon injury  Shingles vaccine: completed series   Advanced directives: will provide copies   Conditions/risks identified: none   Next appointment: 06/10/2021  100pm   With Dr. Ethlyn Gallery    Preventive Care 8 Years and Older, Female Preventive care refers to lifestyle choices and visits with your health care provider that can promote health and wellness. What does preventive care include?  A yearly physical exam. This is also called an annual well check.  Dental exams once or twice a year.  Routine eye exams. Ask your health care provider how often you should have your eyes checked.  Personal lifestyle choices, including:  Daily care of your teeth and gums.  Regular physical activity.  Eating a healthy diet.  Avoiding tobacco and drug use.  Limiting alcohol use.  Practicing safe sex.  Taking low-dose aspirin every day.  Taking vitamin and mineral supplements as recommended by your health care provider. What happens during an annual well check? The services and screenings done by your health care provider during your annual well check will depend on your age, overall health, lifestyle risk factors, and family history of disease. Counseling  Your health care provider may ask you questions about your:  Alcohol use.  Tobacco use.  Drug  use.  Emotional well-being.  Home and relationship well-being.  Sexual activity.  Eating habits.  History of falls.  Memory and ability to understand (cognition).  Work and work Statistician.  Reproductive health. Screening  You may have the following tests or measurements:  Height, weight, and BMI.  Blood pressure.  Lipid and cholesterol levels. These may be checked every 5 years, or more frequently if you are over 56 years old.  Skin check.  Lung cancer screening. You may have this screening every year starting at age 52 if you have a 30-pack-year history of smoking and currently smoke or have quit within the past 15 years.  Fecal occult blood test (FOBT) of the stool. You may have this test every year starting at age 12.  Flexible sigmoidoscopy or colonoscopy. You may have a sigmoidoscopy every 5 years or a colonoscopy every 10 years starting at age 71.  Hepatitis C blood test.  Hepatitis B blood test.  Sexually transmitted disease (STD) testing.  Diabetes screening. This is done by checking your blood sugar (glucose) after you have not eaten for a while (fasting). You may have this done every 1-3 years.  Bone density scan. This is done to screen for osteoporosis. You may have this done starting at age 47.  Mammogram. This may be done every 1-2 years. Talk to your health care provider about how often you should have regular mammograms. Talk with your health care provider about your test results, treatment options, and if necessary, the need for more tests. Vaccines  Your health care provider may recommend certain vaccines, such as:  Influenza vaccine. This is recommended every year.  Tetanus, diphtheria, and acellular pertussis (Tdap, Td) vaccine. You  may need a Td booster every 10 years.  Zoster vaccine. You may need this after age 41.  Pneumococcal 13-valent conjugate (PCV13) vaccine. One dose is recommended after age 25.  Pneumococcal polysaccharide  (PPSV23) vaccine. One dose is recommended after age 81. Talk to your health care provider about which screenings and vaccines you need and how often you need them. This information is not intended to replace advice given to you by your health care provider. Make sure you discuss any questions you have with your health care provider. Document Released: 12/31/2015 Document Revised: 08/23/2016 Document Reviewed: 10/05/2015 Elsevier Interactive Patient Education  2017 [place] Prevention in the Home Falls can cause injuries. They can happen to people of all ages. There are many things you can do to make your home safe and to help prevent falls. What can I do on the outside of my home?  Regularly fix the edges of walkways and driveways and fix any cracks.  Remove anything that might make you trip as you walk through a door, such as a raised step or threshold.  Trim any bushes or trees on the path to your home.  Use bright outdoor lighting.  Clear any walking paths of anything that might make someone trip, such as rocks or tools.  Regularly check to see if handrails are loose or broken. Make sure that both sides of any steps have handrails.  Any raised decks and porches should have guardrails on the edges.  Have any leaves, snow, or ice cleared regularly.  Use sand or salt on walking paths during winter.  Clean up any spills in your garage right away. This includes oil or grease spills. What can I do in the bathroom?  Use night lights.  Install grab bars by the toilet and in the tub and shower. Do not use towel bars as grab bars.  Use non-skid mats or decals in the tub or shower.  If you need to sit down in the shower, use a plastic, non-slip stool.  Keep the floor dry. Clean up any water that spills on the floor as soon as it happens.  Remove soap buildup in the tub or shower regularly.  Attach bath mats securely with double-sided non-slip rug tape.  Do not have  throw rugs and other things on the floor that can make you trip. What can I do in the bedroom?  Use night lights.  Make sure that you have a light by your bed that is easy to reach.  Do not use any sheets or blankets that are too big for your bed. They should not hang down onto the floor.  Have a firm chair that has side arms. You can use this for support while you get dressed.  Do not have throw rugs and other things on the floor that can make you trip. What can I do in the kitchen?  Clean up any spills right away.  Avoid walking on wet floors.  Keep items that you use a lot in easy-to-reach places.  If you need to reach something above you, use a strong step stool that has a grab bar.  Keep electrical cords out of the way.  Do not use floor polish or wax that makes floors slippery. If you must use wax, use non-skid floor wax.  Do not have throw rugs and other things on the floor that can make you trip. What can I do with my stairs?  Do not leave any items  on the stairs.  Make sure that there are handrails on both sides of the stairs and use them. Fix handrails that are broken or loose. Make sure that handrails are as long as the stairways.  Check any carpeting to make sure that it is firmly attached to the stairs. Fix any carpet that is loose or worn.  Avoid having throw rugs at the top or bottom of the stairs. If you do have throw rugs, attach them to the floor with carpet tape.  Make sure that you have a light switch at the top of the stairs and the bottom of the stairs. If you do not have them, ask someone to add them for you. What else can I do to help prevent falls?  Wear shoes that:  Do not have high heels.  Have rubber bottoms.  Are comfortable and fit you well.  Are closed at the toe. Do not wear sandals.  If you use a stepladder:  Make sure that it is fully opened. Do not climb a closed stepladder.  Make sure that both sides of the stepladder are  locked into place.  Ask someone to hold it for you, if possible.  Clearly mark and make sure that you can see:  Any grab bars or handrails.  First and last steps.  Where the edge of each step is.  Use tools that help you move around (mobility aids) if they are needed. These include:  Canes.  Walkers.  Scooters.  Crutches.  Turn on the lights when you go into a dark area. Replace any light bulbs as soon as they burn out.  Set up your furniture so you have a clear path. Avoid moving your furniture around.  If any of your floors are uneven, fix them.  If there are any pets around you, be aware of where they are.  Review your medicines with your doctor. Some medicines can make you feel dizzy. This can increase your chance of falling. Ask your doctor what other things that you can do to help prevent falls. This information is not intended to replace advice given to you by your health care provider. Make sure you discuss any questions you have with your health care provider. Document Released: 09/30/2009 Document Revised: 05/11/2016 Document Reviewed: 01/08/2015 Elsevier Interactive Patient Education  2017 [employer].

## 2021-04-22 ENCOUNTER — Encounter: Payer: Self-pay | Admitting: Family Medicine

## 2021-04-26 ENCOUNTER — Other Ambulatory Visit: Payer: Self-pay | Admitting: Family Medicine

## 2021-04-26 DIAGNOSIS — Z1231 Encounter for screening mammogram for malignant neoplasm of breast: Secondary | ICD-10-CM

## 2021-05-10 ENCOUNTER — Telehealth: Payer: Self-pay | Admitting: Family Medicine

## 2021-05-10 DIAGNOSIS — F4323 Adjustment disorder with mixed anxiety and depressed mood: Secondary | ICD-10-CM

## 2021-05-10 MED ORDER — VENLAFAXINE HCL ER 37.5 MG PO CP24: ORAL_CAPSULE | ORAL | 0 refills | Status: DC

## 2021-05-10 NOTE — Telephone Encounter (Signed)
Pt call and stated she need a refill on  venlafaxine XR (EFFEXOR-XR) 37.5 MG 24 hr capsule and she stated she want a 90 day supply sent to  [place], [place], [address] Phone:  [phone]  Fax:  [phone]

## 2021-05-10 NOTE — Telephone Encounter (Signed)
Rx sent for a 30-day supply as the patient has not been seen in over 1 yr.

## 2021-06-07 DIAGNOSIS — H43813 Vitreous degeneration, bilateral: Secondary | ICD-10-CM | POA: Diagnosis not present

## 2021-06-07 DIAGNOSIS — H25813 Combined forms of age-related cataract, bilateral: Secondary | ICD-10-CM | POA: Diagnosis not present

## 2021-06-07 DIAGNOSIS — H04123 Dry eye syndrome of bilateral lacrimal glands: Secondary | ICD-10-CM | POA: Diagnosis not present

## 2021-06-10 ENCOUNTER — Ambulatory Visit (INDEPENDENT_AMBULATORY_CARE_PROVIDER_SITE_OTHER): Payer: Medicare Other | Admitting: Family Medicine

## 2021-06-10 ENCOUNTER — Other Ambulatory Visit: Payer: Self-pay

## 2021-06-10 ENCOUNTER — Encounter: Payer: Self-pay | Admitting: Family Medicine

## 2021-06-10 VITALS — BP 128/80 | HR 95 | Temp 98.6°F | Ht 64.75 in | Wt 166.0 lb

## 2021-06-10 DIAGNOSIS — K5180 Other ulcerative colitis without complications: Secondary | ICD-10-CM | POA: Diagnosis not present

## 2021-06-10 DIAGNOSIS — Z13220 Encounter for screening for lipoid disorders: Secondary | ICD-10-CM

## 2021-06-10 DIAGNOSIS — Z131 Encounter for screening for diabetes mellitus: Secondary | ICD-10-CM | POA: Diagnosis not present

## 2021-06-10 DIAGNOSIS — M818 Other osteoporosis without current pathological fracture: Secondary | ICD-10-CM | POA: Diagnosis not present

## 2021-06-10 DIAGNOSIS — Z0000 Encounter for general adult medical examination without abnormal findings: Secondary | ICD-10-CM | POA: Diagnosis not present

## 2021-06-10 DIAGNOSIS — F4323 Adjustment disorder with mixed anxiety and depressed mood: Secondary | ICD-10-CM

## 2021-06-10 LAB — LIPID PANEL
Cholesterol: 239 mg/dL — ABNORMAL HIGH (ref 0–200)
HDL: 71.7 mg/dL (ref >39.00)
NonHDL: 167.49
Total CHOL/HDL Ratio: 3
Triglycerides: 254 mg/dL — ABNORMAL HIGH (ref 0.0–149.0)
VLDL: 50.8 mg/dL — ABNORMAL HIGH (ref 0.0–40.0)

## 2021-06-10 LAB — COMPREHENSIVE METABOLIC PANEL
ALT: 19 U/L (ref 0–35)
AST: 23 U/L (ref 0–37)
Albumin: 4.4 g/dL (ref 3.5–5.2)
Alkaline Phosphatase: 86 U/L (ref 39–117)
BUN: 20 mg/dL (ref 6–23)
CO2: 25 mEq/L (ref 19–32)
Calcium: 9.9 mg/dL (ref 8.4–10.5)
Chloride: 105 mEq/L (ref 96–112)
Creatinine, Ser: 0.88 mg/dL (ref 0.40–1.20)
GFR: 65.38 mL/min (ref >60.00)
Glucose, Bld: 121 mg/dL — ABNORMAL HIGH (ref 70–99)
Potassium: 4.3 mEq/L (ref 3.5–5.1)
Sodium: 140 mEq/L (ref 135–145)
Total Bilirubin: 0.7 mg/dL (ref 0.2–1.2)
Total Protein: 6.7 g/dL (ref 6.0–8.3)

## 2021-06-10 LAB — LDL CHOLESTEROL, DIRECT: Direct LDL: 136 mg/dL

## 2021-06-10 LAB — CBC WITH DIFFERENTIAL/PLATELET
Basophils Absolute: 0.1 10*3/uL (ref 0.0–0.1)
Basophils Relative: 1.4 % (ref 0.0–3.0)
Eosinophils Absolute: 0.2 10*3/uL (ref 0.0–0.7)
Eosinophils Relative: 3.7 % (ref 0.0–5.0)
HCT: 40.3 % (ref 36.0–46.0)
Hemoglobin: 13.7 g/dL (ref 12.0–15.0)
Lymphocytes Relative: 32.4 % (ref 12.0–46.0)
Lymphs Abs: 1.9 10*3/uL (ref 0.7–4.0)
MCHC: 34 g/dL (ref 30.0–36.0)
MCV: 92.8 fl (ref 78.0–100.0)
Monocytes Absolute: 0.6 10*3/uL (ref 0.1–1.0)
Monocytes Relative: 10.7 % (ref 3.0–12.0)
Neutro Abs: 3.1 10*3/uL (ref 1.4–7.7)
Neutrophils Relative %: 51.8 % (ref 43.0–77.0)
Platelets: 225 10*3/uL (ref 150.0–400.0)
RBC: 4.34 Mil/uL (ref 3.87–5.11)
RDW: 13.7 % (ref 11.5–15.5)
WBC: 6 10*3/uL (ref 4.0–10.5)

## 2021-06-10 LAB — VITAMIN D 25 HYDROXY (VIT D DEFICIENCY, FRACTURES): VITD: 49.11 ng/mL (ref 30.00–100.00)

## 2021-06-10 MED ORDER — VENLAFAXINE HCL ER 37.5 MG PO CP24: ORAL_CAPSULE | ORAL | 3 refills | Status: DC

## 2021-06-10 NOTE — Progress Notes (Signed)
Whitney Beasley DOB: [birth year] Encounter date: 06/10/2021  This is a 75 y.o. female who presents for complete physical   History of present illness/Additional concerns: Last visit with me was 06/2019  Adjustment disorder/anxiety/depression: effexor 3x/week; feels that she is doing well with this.   Osteoporosis: managed by gyn. She had bone density follow up w Dr. Julien Girt and was taken off of medication. She had follow up visit last week. Calcium and vitamin D. Hasn't restarted exercise yet.   Ulcerative colitis: last colonoscopy 07/2019; repeat suggested 08/2024. Bowels have been doing well.   Mammogram: has visit set up for next month.   Has renovated parents' house and moved in. Still working on Leisure centre manager.   Did get both COVID boosters.   Follows with Dr. Julien Girt for gyn needs.  Follows with Dr. Katy Fitch for ophtho; monitoring cataracts at this time. Follow up in January. Follows with Dr. Martin Majestic for derm care  Past Medical History:  Diagnosis Date   Allergy    Asthma    as child   Depression    GERD (gastroesophageal reflux disease)    Ulcerative colitis ([place])    UTI (urinary tract infection)    Past Surgical History:  Procedure Laterality Date   bone spur left pinky finger under local  07/30/2019   COLONOSCOPY  04/01/2014, 2013   2015 normal exam,  in [place] Bilateral 1950's   Allergies  Allergen Reactions   Minocycline Other (See Comments)    Joint pain and Fatigue, increase liver enzymes    Current Meds  Medication Sig   B Complex-C (SUPER B COMPLEX PO) Take 1 tablet by mouth daily.   calcium carbonate 200 MG capsule Take 1,600 mg by mouth daily.    clindamycin (CLINDAGEL) 1 % gel Apply topically 2 (two) times daily.   Glucosamine-Chondroitin 750-600 MG TABS Take 1 tablet by mouth daily.   Multiple Vitamin (MULTIVITAMIN) tablet Take 1 tablet by mouth daily.   tretinoin (RETIN-A) 0.025 % gel Apply topically as needed.   VITAMIN D PO Take 1,600 mg by  mouth daily.   zinc gluconate 50 MG tablet Take 50 mg by mouth daily.   [DISCONTINUED] venlafaxine XR (EFFEXOR-XR) 37.5 MG 24 hr capsule TAKE 1 CAPSULE BY MOUTH 3  TIMES WEEKLY   Social History   Tobacco Use   Smoking status: Never   Smokeless tobacco: Never  Substance Use Topics   Alcohol use: Yes    Alcohol/week: 14.0 standard drinks    Types: 14 Glasses of wine per week    Comment: 2 glasses of wine per day;    Family History  Problem Relation Age of Onset   Diabetes Mother        had MI late 46's   Colon polyps Mother    COPD Father        smoker, but stopped in teh 54   Colon polyps Father    Colon cancer Paternal Grandfather    Arthritis Sister    Diabetes Maternal Grandmother    Cancer Maternal Grandfather    Esophageal cancer Neg Hx    Rectal cancer Neg Hx    Stomach cancer Neg Hx      Review of Systems  Constitutional:  Negative for activity change, appetite change, chills, fatigue, fever and unexpected weight change.  HENT:  Negative for congestion, ear pain, hearing loss, sinus pressure, sinus pain, sore throat and trouble swallowing.   Eyes:  Negative for pain and visual disturbance.  Respiratory:  Negative for cough, chest tightness, shortness of breath and wheezing.   Cardiovascular:  Negative for chest pain, palpitations and leg swelling.  Gastrointestinal:  Negative for abdominal pain, blood in stool, constipation, diarrhea, nausea and vomiting.  Genitourinary:  Negative for difficulty urinating and menstrual problem.  Musculoskeletal:  Negative for arthralgias and back pain.  Skin:  Negative for rash.  Neurological:  Negative for dizziness, weakness, numbness and headaches.  Hematological:  Negative for adenopathy. Does not bruise/bleed easily.  Psychiatric/Behavioral:  Negative for sleep disturbance and suicidal ideas. The patient is not nervous/anxious.    CBC:  Lab Results  Component Value Date   WBC 5.3 04/15/2018   HGB 14.3 04/15/2018   HCT  42.2 04/15/2018   MCHC 33.8 04/15/2018   RDW 13.4 04/15/2018   PLT 216.0 04/15/2018   CMP: Lab Results  Component Value Date   NA 142 07/02/2019   K 4.3 07/02/2019   CL 107 07/02/2019   CO2 27 07/02/2019   GLUCOSE 84 07/02/2019   BUN 16 07/02/2019   CREATININE 0.91 07/02/2019   CALCIUM 9.4 07/02/2019   PROT 6.6 07/02/2019   BILITOT 0.7 07/02/2019   ALKPHOS 70 07/02/2019   ALT 16 07/02/2019   AST 18 07/02/2019   LIPID: Lab Results  Component Value Date   CHOL 214 (H) 07/02/2019   TRIG 136.0 07/02/2019   HDL 66.70 07/02/2019   LDLCALC 120 (H) 07/02/2019    Objective:  Pulse 95   Temp 98.6 F (37 C) (Oral)   Ht 5' 4.75" (1.645 m)   Wt 166 lb (75.3 kg)   SpO2 97%   BMI 27.84 kg/m   Weight: 166 lb (75.3 kg)   BP Readings from Last 3 Encounters:  08/05/19 112/74  07/02/19 118/84  02/05/19 140/86   Wt Readings from Last 3 Encounters:  06/10/21 166 lb (75.3 kg)  08/05/19 163 lb (73.9 kg)  07/16/19 163 lb (73.9 kg)    Physical Exam Constitutional:      General: She is not in acute distress.    Appearance: She is well-developed.  HENT:     Head: Normocephalic and atraumatic.     Right Ear: External ear normal.     Left Ear: External ear normal.     Mouth/Throat:     Pharynx: No oropharyngeal exudate.  Eyes:     Conjunctiva/sclera: Conjunctivae normal.     Pupils: Pupils are equal, round, and reactive to light.  Neck:     Thyroid: No thyromegaly.  Cardiovascular:     Rate and Rhythm: Normal rate and regular rhythm.     Heart sounds: Normal heart sounds. No murmur heard.   No friction rub. No gallop.  Pulmonary:     Effort: Pulmonary effort is normal.     Breath sounds: Normal breath sounds.  Abdominal:     General: Bowel sounds are normal. There is no distension.     Palpations: Abdomen is soft. There is no mass.     Tenderness: There is no abdominal tenderness. There is no guarding.     Hernia: No hernia is present.  Musculoskeletal:         General: No tenderness or deformity. Normal range of motion.     Cervical back: Normal range of motion and neck supple.  Lymphadenopathy:     Cervical: No cervical adenopathy.  Skin:    General: Skin is warm and dry.     Findings: No rash.  Neurological:     Mental Status:  She is alert and oriented to person, place, and time.     Deep Tendon Reflexes: Reflexes normal.     Reflex Scores:      Tricep reflexes are 2+ on the right side and 2+ on the left side.      Bicep reflexes are 2+ on the right side and 2+ on the left side.      Brachioradialis reflexes are 2+ on the right side and 2+ on the left side.      Patellar reflexes are 2+ on the right side and 2+ on the left side. Psychiatric:        Speech: Speech normal.        Behavior: Behavior normal.        Thought Content: Thought content normal.    Assessment/Plan: Health Maintenance Due  Topic Date Due   TETANUS/TDAP  12/22/2017   MAMMOGRAM  06/08/2021   Health Maintenance reviewed - she is up to date.  1. Preventative health care Keep up with active lifestyle. Up to date with preventative health needs.   2. Other ulcerative colitis without complication ([place]) Does follow with GI. Sx are stable.  - CBC with Differential/Platelet; Future  3. Other osteoporosis, unspecified pathological fracture presence Was taken off of evista. Continues with ca/D. Will check D levels today. Requesting dexa from gyn.  - VITAMIN D 25 Hydroxy (Vit-D Deficiency, Fractures); Future  4. Adjustment disorder with mixed anxiety and depressed mood Stable. Continue with effexor.  - venlafaxine XR (EFFEXOR-XR) 37.5 MG 24 hr capsule; TAKE 1 CAPSULE BY MOUTH 3  TIMES WEEKLY  Dispense: 90 capsule; Refill: 3  5. Lipid screening - Lipid panel; Future  6. Screening for diabetes mellitus - Comprehensive metabolic panel; Future    Return in about 1 year (around 06/10/2022) for physical exam.  Micheline Rough, MD

## 2021-06-10 NOTE — Patient Instructions (Addendum)
*  Tdap is the tetanus shot you want from the pharmacy  *compression stockings 20-36mHg ([facility] nursing [facility]; [place]

## 2021-06-26 ENCOUNTER — Other Ambulatory Visit: Payer: Self-pay | Admitting: Family Medicine

## 2021-06-26 DIAGNOSIS — E785 Hyperlipidemia, unspecified: Secondary | ICD-10-CM

## 2021-06-26 MED ORDER — ROSUVASTATIN CALCIUM 5 MG PO TABS: 5.0000 mg | ORAL_TABLET | Freq: Every day | ORAL | 3 refills | Status: DC

## 2021-06-28 ENCOUNTER — Other Ambulatory Visit: Payer: Self-pay

## 2021-06-28 ENCOUNTER — Ambulatory Visit
Admission: RE | Admit: 2021-06-28 | Discharge: 2021-06-28 | Disposition: A | Discharge status: Home / Self Care | Payer: Medicare Other | Source: Ambulatory Visit | Attending: Family Medicine | Admitting: Family Medicine

## 2021-06-28 DIAGNOSIS — L700 Acne vulgaris: Secondary | ICD-10-CM | POA: Diagnosis not present

## 2021-06-28 DIAGNOSIS — L821 Other seborrheic keratosis: Secondary | ICD-10-CM | POA: Diagnosis not present

## 2021-06-28 DIAGNOSIS — Z1231 Encounter for screening mammogram for malignant neoplasm of breast: Secondary | ICD-10-CM | POA: Diagnosis not present

## 2021-06-28 DIAGNOSIS — L570 Actinic keratosis: Secondary | ICD-10-CM | POA: Diagnosis not present

## 2021-06-28 DIAGNOSIS — L603 Nail dystrophy: Secondary | ICD-10-CM | POA: Diagnosis not present

## 2021-06-28 DIAGNOSIS — L814 Other melanin hyperpigmentation: Secondary | ICD-10-CM | POA: Diagnosis not present

## 2021-09-06 ENCOUNTER — Encounter: Payer: Self-pay | Admitting: Family Medicine

## 2021-09-21 ENCOUNTER — Encounter: Payer: Self-pay | Admitting: Family Medicine

## 2021-10-05 ENCOUNTER — Ambulatory Visit: Payer: Medicare Other | Admitting: Family Medicine

## 2021-10-05 ENCOUNTER — Ambulatory Visit (INDEPENDENT_AMBULATORY_CARE_PROVIDER_SITE_OTHER): Payer: Medicare Other | Admitting: *Deleted

## 2021-10-05 ENCOUNTER — Other Ambulatory Visit (INDEPENDENT_AMBULATORY_CARE_PROVIDER_SITE_OTHER): Payer: Medicare Other

## 2021-10-05 ENCOUNTER — Other Ambulatory Visit: Payer: Self-pay

## 2021-10-05 DIAGNOSIS — E785 Hyperlipidemia, unspecified: Secondary | ICD-10-CM | POA: Diagnosis not present

## 2021-10-05 DIAGNOSIS — Z23 Encounter for immunization: Secondary | ICD-10-CM

## 2021-10-05 LAB — LIPID PANEL
Cholesterol: 172 mg/dL (ref 0–200)
HDL: 70.3 mg/dL
LDL Cholesterol: 81 mg/dL (ref 0–99)
NonHDL: 101.92
Total CHOL/HDL Ratio: 2
Triglycerides: 106 mg/dL (ref 0.0–149.0)
VLDL: 21.2 mg/dL (ref 0.0–40.0)

## 2021-10-05 NOTE — Progress Notes (Deleted)
  Whitney Beasley DOB: 03-25-1946 Encounter date: 10/05/2021  This is a 75 y.o. female who presents with No chief complaint on file.   History of present illness: Last visit was 06/10/21   HPI   Allergies  Allergen Reactions   Minocycline Other (See Comments)    Joint pain and Fatigue, increase liver enzymes    No outpatient medications have been marked as taking for the 10/05/21 encounter (Appointment) with Caren Macadam, MD.    Review of Systems  Objective:  There were no vitals taken for this visit.      BP Readings from Last 3 Encounters:  06/10/21 128/80  08/05/19 112/74  07/02/19 118/84   Wt Readings from Last 3 Encounters:  06/10/21 166 lb (75.3 kg)  08/05/19 163 lb (73.9 kg)  07/16/19 163 lb (73.9 kg)    Physical Exam  Assessment/Plan  There are no diagnoses linked to this encounter.       Micheline Rough, MD

## 2021-10-21 LAB — HM HEPATITIS C SCREENING LAB: HM Hepatitis Screen: NEGATIVE

## 2021-11-22 IMAGING — MG MM DIGITAL SCREENING BILAT W/ TOMO AND CAD
8 series · 8 of 24 positions shown · non-contrast
Comparison: Previous exam(s).

CLINICAL DATA: Screening.

EXAM:
DIGITAL SCREENING BILATERAL MAMMOGRAM WITH TOMOSYNTHESIS AND CAD
TECHNIQUE: Bilateral screening digital craniocaudal and mediolateral oblique
mammograms were obtained. Bilateral screening digital breast
tomosynthesis was performed. The images were evaluated with
computer-aided detection.

[L MLO synth-2D]
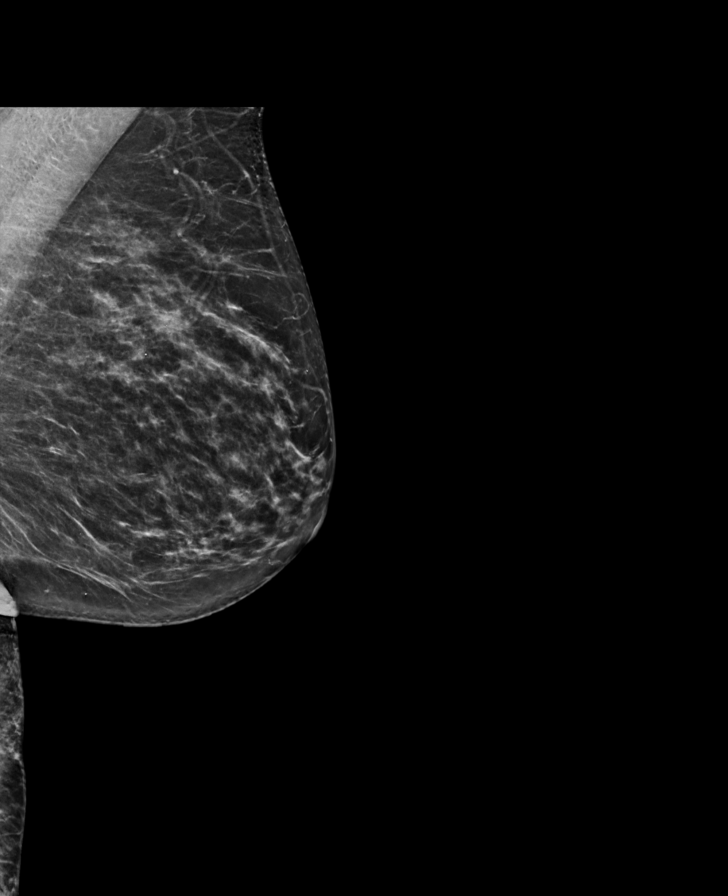

[R CC synth-2D]
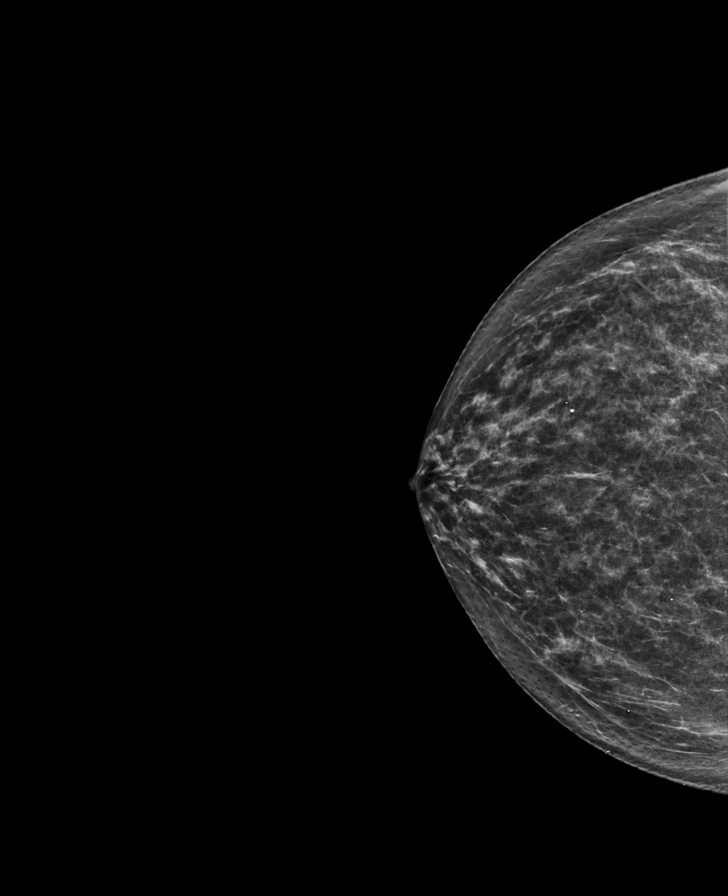

[L CC synth-2D]
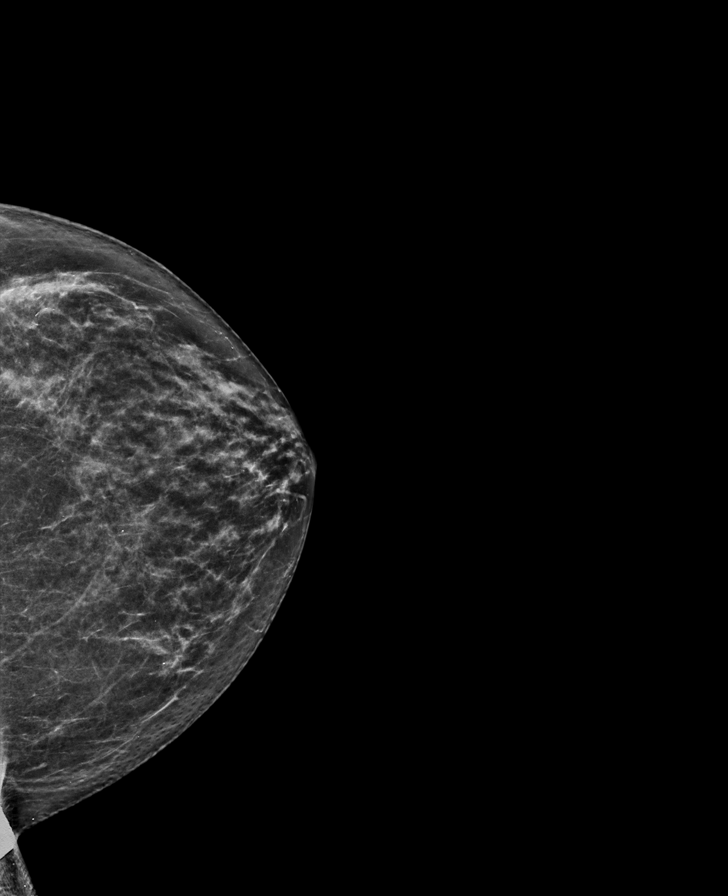

[R MLO synth-2D]
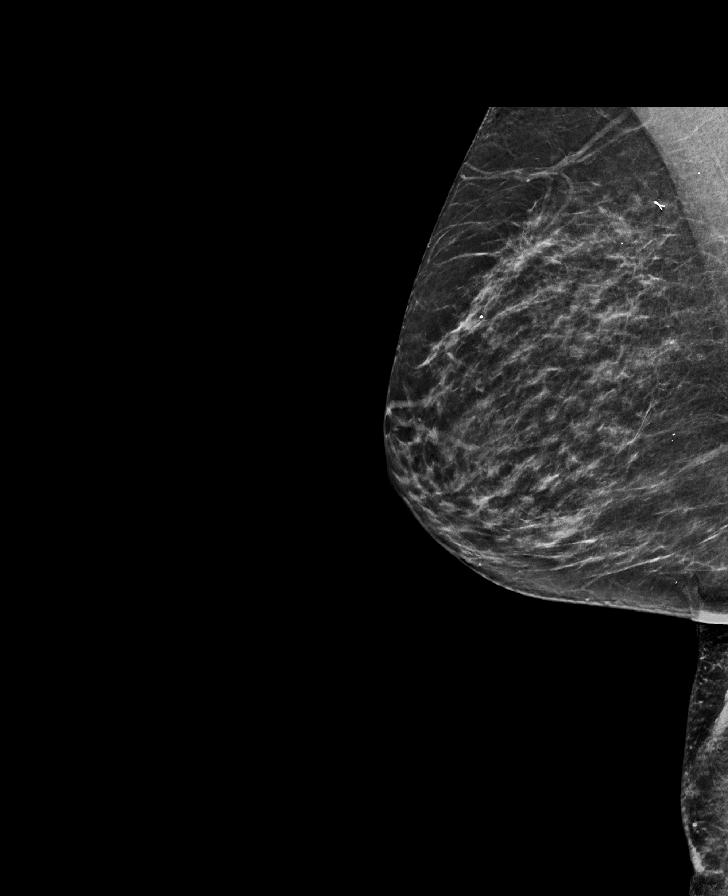

[R CC tomo · tomo slice 31/61.0]
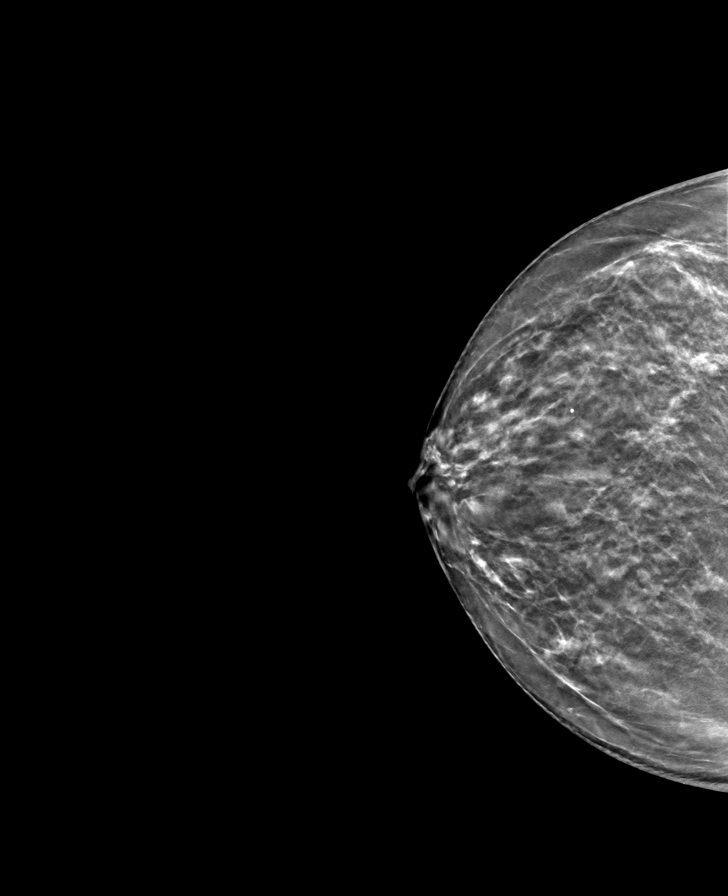

[L MLO tomo · tomo slice 35/69.0]
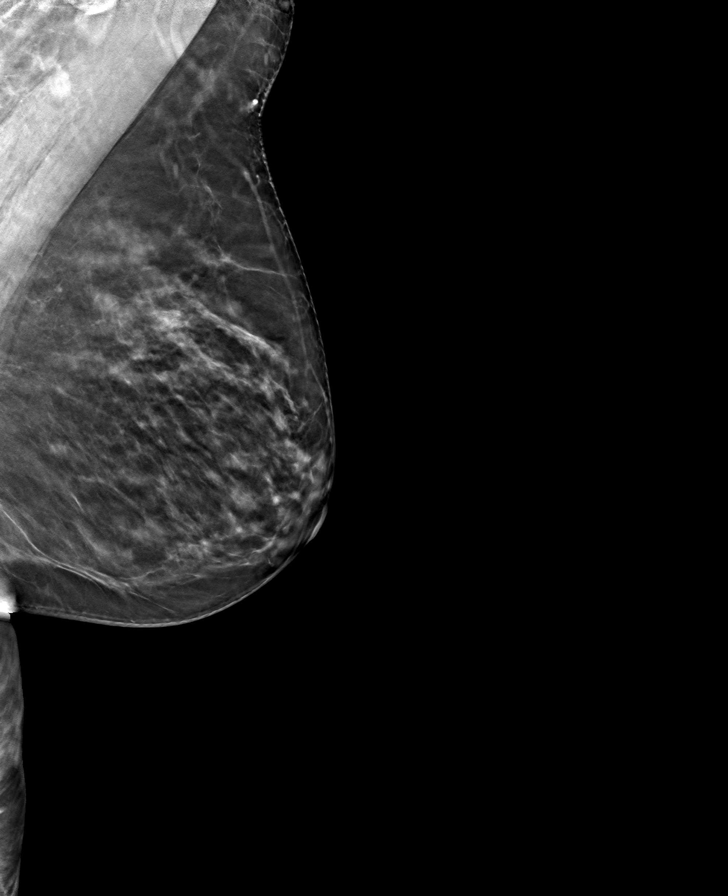

[L CC tomo · tomo slice 31/62.0]
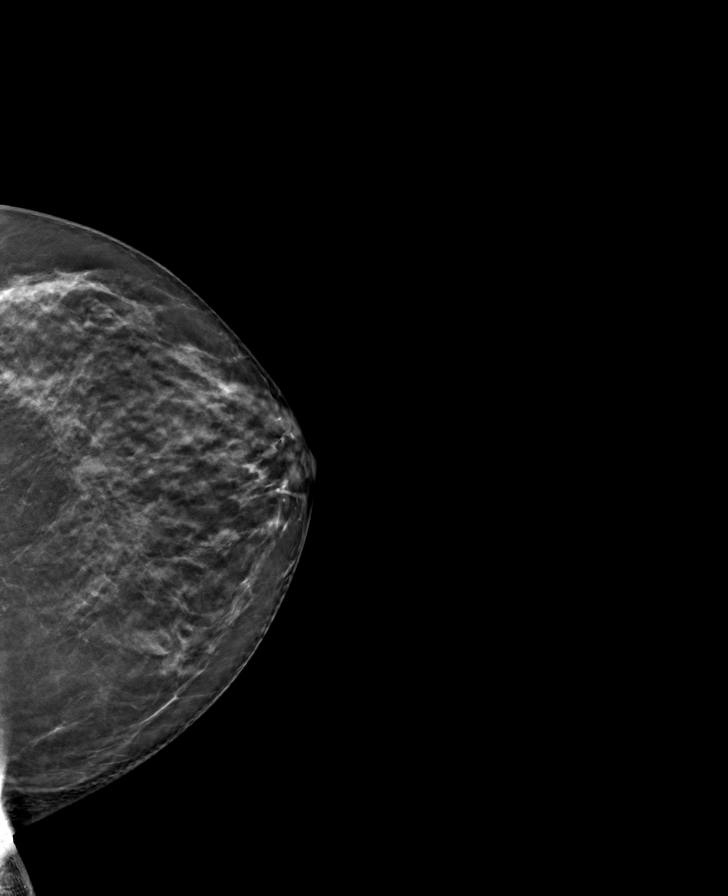

[R MLO tomo · tomo slice 33/65.0]
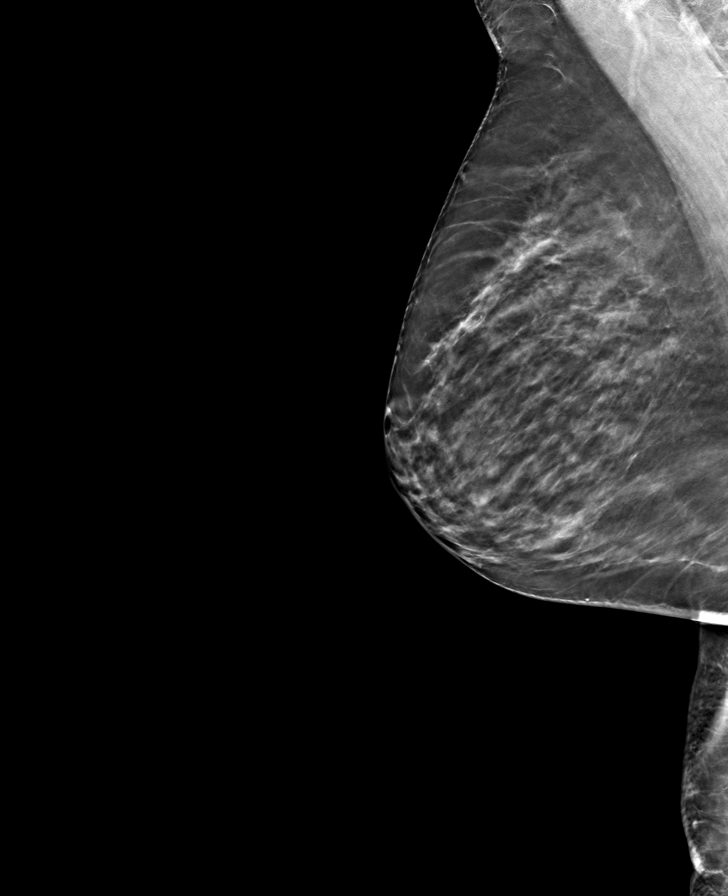

[8 of 24 positions shown; findings below may reference images not displayed]

ACR Breast Density Category c: The breast tissue is heterogeneously
dense, which may obscure small masses.
FINDINGS: There are no findings suspicious for malignancy.
IMPRESSION: No mammographic evidence of malignancy. A result letter of this
screening mammogram will be mailed directly to the patient.

RECOMMENDATION:
Screening mammogram in one year. (Code:Q3-W-BC3)

BI-RADS CATEGORY  1: Negative.

## 2021-12-27 DIAGNOSIS — H25811 Combined forms of age-related cataract, right eye: Secondary | ICD-10-CM | POA: Diagnosis not present

## 2021-12-27 DIAGNOSIS — H04123 Dry eye syndrome of bilateral lacrimal glands: Secondary | ICD-10-CM | POA: Diagnosis not present

## 2021-12-27 DIAGNOSIS — H43813 Vitreous degeneration, bilateral: Secondary | ICD-10-CM | POA: Diagnosis not present

## 2022-01-12 ENCOUNTER — Encounter: Payer: Self-pay | Admitting: Family Medicine

## 2022-03-10 DIAGNOSIS — H25811 Combined forms of age-related cataract, right eye: Secondary | ICD-10-CM | POA: Diagnosis not present

## 2022-04-05 DIAGNOSIS — H2512 Age-related nuclear cataract, left eye: Secondary | ICD-10-CM | POA: Diagnosis not present

## 2022-04-06 ENCOUNTER — Telehealth: Payer: Self-pay | Admitting: Family Medicine

## 2022-04-06 NOTE — Telephone Encounter (Signed)
Left message for patient to call back and schedule Medicare Annual Wellness Visit (AWV) either virtually or in office. Left  my Herbie Drape number [phone] ? ? ?Last AWV ;04/11/21 ?please schedule at anytime with [facility] Nurse Health Advisor 1 or 2 ? ? ? ?

## 2022-04-07 DIAGNOSIS — H25812 Combined forms of age-related cataract, left eye: Secondary | ICD-10-CM | POA: Diagnosis not present

## 2022-04-20 ENCOUNTER — Ambulatory Visit (INDEPENDENT_AMBULATORY_CARE_PROVIDER_SITE_OTHER): Payer: Medicare Other

## 2022-04-20 VITALS — Ht 65.0 in | Wt 164.0 lb

## 2022-04-20 DIAGNOSIS — Z0000 Encounter for general adult medical examination without abnormal findings: Secondary | ICD-10-CM | POA: Diagnosis not present

## 2022-04-20 NOTE — Progress Notes (Signed)
? ?Subjective:  ? Whitney Beasley is a 77 y.o. female who presents for Medicare Annual (Subsequent) preventive examination. ? ?Review of Systems    ?Virtual Visit via Telephone Note ? ?I connected with  Whitney Beasley on 04/20/22 at  1:00 PM EDT by telephone and verified that I am speaking with the correct person using two identifiers. ? ?Location: ?Patient: Home ?Provider: Office ?Persons participating in the virtual visit: patient/Nurse Health Advisor ?  ?I discussed the limitations, risks, security and privacy concerns of performing an evaluation and management service by telephone and the availability of in person appointments. The patient expressed understanding and agreed to proceed. ? ?Interactive audio and video telecommunications were attempted between this nurse and patient, however failed, due to patient having technical difficulties OR patient did not have access to video capability.  We continued and completed visit with audio only. ? ?Some vital signs may be absent or patient reported.  ? ?Criselda Peaches, LPN  ?Cardiac Risk Factors include: advanced age (>52mn, >>37women) ? ?   ?Objective:  ?  ?Today's Vitals  ? 04/20/22 1258  ?Weight: 164 lb (74.4 kg)  ?Height: 5' 5"  (1.651 m)  ? ?Body mass index is 27.29 kg/m?. ? ? ?  04/20/2022  ?  1:08 PM 04/11/2021  ? 12:46 PM 02/09/2020  ?  2:36 PM 02/05/2019  ?  4:56 PM 01/29/2018  ?  2:40 PM  ?Advanced Directives  ?Does Patient Have a Medical Advance Directive? Yes Yes Yes Yes Yes  ?Type of [place] will [place] will [place] will [place] will   ?Does patient want to make changes to medical advance directive? No - Patient declined  No - Patient declined No - Patient declined   ?Copy of [place] Chart? No - copy requested No - copy requested Yes - validated most recent copy scanned in chart (See row information) No -  copy requested   ? ? ?Current Medications (verified) ?Outpatient Encounter Medications as of 04/20/2022  ?Medication Sig  ? B Complex-C (SUPER B COMPLEX PO) Take 1 tablet by mouth daily.  ? calcium carbonate 200 MG capsule Take 1,600 mg by mouth daily.   ? clindamycin (CLINDAGEL) 1 % gel Apply topically 2 (two) times daily.  ? Glucosamine-Chondroitin 750-600 MG TABS Take 1 tablet by mouth daily.  ? Multiple Vitamin (MULTIVITAMIN) tablet Take 1 tablet by mouth daily.  ? rosuvastatin (CRESTOR) 5 MG tablet Take 1 tablet (5 mg total) by mouth daily.  ? tretinoin (RETIN-A) 0.025 % gel Apply topically as needed.  ? venlafaxine XR (EFFEXOR-XR) 37.5 MG 24 hr capsule TAKE 1 CAPSULE BY MOUTH 3  TIMES WEEKLY  ? VITAMIN D PO Take 1,600 mg by mouth daily.  ? zinc gluconate 50 MG tablet Take 50 mg by mouth daily.  ? ?No facility-administered encounter medications on file as of 04/20/2022.  ? ? ?Allergies (verified) ?Minocycline  ? ?History: ?Past Medical History:  ?Diagnosis Date  ? Allergy   ? Asthma   ? as child  ? Depression   ? GERD (gastroesophageal reflux disease)   ? [place]   ? UTI (urinary tract infection)   ? ?Past Surgical History:  ?Procedure Laterality Date  ? bone spur left pinky finger under local  07/30/2019  ? COLONOS[place]  04/01/2014, 2013  ? [place]ILLECTOMY Bilateral 1950's  ? ?Family  History  ?Problem Relation Age of Onset  ? Diabetes Mother   ?     had MI late 22's  ? Colon polyps Mother   ? COPD Father   ?     smoker, but stopped in teh 39  ? Colon polyps Father   ? Colon cancer Paternal Grandfather   ? Arthritis Sister   ? Diabetes Maternal Grandmother   ? Cancer Maternal Grandfather   ? Esophageal cancer Neg Hx   ? Rectal cancer Neg Hx   ? Stomach cancer Neg Hx   ? ?Social History  ? ?Socioeconomic History  ? Marital status: Married  ?  Spouse name: Not on file  ? Number of children: 1  ? Years of education: Not on file  ? Highest education level: Not on file   ?Occupational History  ? Not on file  ?Tobacco Use  ? Smoking status: Never  ? Smokeless tobacco: Never  ?Vaping Use  ? Vaping Use: Never used  ?Substance and Sexual Activity  ? Alcohol use: Yes  ?  Alcohol/week: 14.0 standard drinks  ?  Types: 14 Glasses of wine per week  ?  Comment: 2 glasses of wine per day;   ? Drug use: No  ? Sexual activity: Yes  ?  Partners: Male  ?Other Topics Concern  ? Not on file  ?Social History Narrative  ? Lives with husband in 2 story house  ? Has one son and grandson and step-granddaughter  ? Active at gym: Weight-beraing and low-impact exercise classes about 2X/week.  ? Enjoys reading and singing in choral groups  ? ?Social Determinants of Health  ? ?Financial Resource Strain: Low Risk   ? Difficulty of Paying Living Expenses: Not hard at all  ?Food Insecurity: No Food Insecurity  ? Worried About Charity fundraiser in the Last Year: Never true  ? Ran Out of Food in the Last Year: Never true  ?Transportation Needs: No Transportation Needs  ? Lack of Transportation (Medical): No  ? Lack of Transportation (Non-Medical): No  ?Physical Activity: Insufficiently Active  ? Days of Exercise per Week: 3 days  ? Minutes of Exercise per Session: 20 min  ?Stress: No Stress Concern Present  ? Feeling of Stress : Only a little  ?Social Connections: Socially Integrated  ? Frequency of Communication with Friends and Family: More than three times a week  ? Frequency of Social Gatherings with Friends and Family: Once a week  ? Attends Religious Services: More than 4 times per year  ? Active Member of Clubs or Organizations: Yes  ? Attends Archivist Meetings: More than 4 times per year  ? Marital Status: Married  ? ? ? ?Clinical Intake: ? ? ?How often do you need to have someone help you when you read instructions, pamphlets, or other written materials from your doctor or pharmacy?: 1 - Never ? ?Diabetic?  No ? ?Interpreter Needed?: NoActivities of Daily Living ? ?  04/20/2022  ?  1:06 PM  04/17/2022  ?  4:40 PM  ?In your present state of health, do you have any difficulty performing the following activities:  ?Hearing? 0 0  ?Vision? 0 0  ?Difficulty concentrating or making decisions? 0 0  ?Walking or climbing stairs? 0 0  ?Dressing or bathing? 0 0  ?Doing errands, shopping? 0 0  ?Preparing Food and eating ? N N  ?Using the Toilet? N N  ?In the past six months, have you accidently leaked urine? N  Y  ?Do you have problems with loss of bowel control? N Y  ?Managing your Medications? N N  ?Managing your Finances? N N  ?Housekeeping or managing your Housekeeping? N N  ? ? ?Patient Care Team: ?Caren Macadam, MD as PCP - General (Family Medicine) ?Marylynn Pearson, MD as Consulting Physician (Obstetrics and Gynecology) ?Druscilla Brownie, MD as Referring Physician (Dermatology) ? ?Indicate any recent Medical Services you may have received from other than [facility] providers in the past year (date may be approximate). ? ?   ?Assessment:  ? This is a routine wellness examination for [place]. ? ?Hearing/Vision screen ?Hearing Screening - Comments:: No hearing difficulty ?Vision Screening - Comments:: Wears reading glasses. Followed by Dr Katy Fitch ? ?Dietary issues and exercise activities discussed: ?Exercise limited by: None identified ? ? Goals Addressed   ? ?  ?  ?  ?  ?  ? This Visit's Progress  ?   Increase physical activity (pt-stated)     ? ?  ? ?Depression Screen ? ?  04/20/2022  ?  1:03 PM 04/11/2021  ? 12:48 PM 04/11/2021  ? 12:42 PM 02/09/2020  ?  2:41 PM 02/05/2019  ? 11:41 AM 01/29/2018  ?  2:41 PM 03/30/2017  ? 10:38 AM  ?PHQ 2/9 Scores  ?PHQ - 2 Score 0 0 0 0 0 0 1  ?PHQ- 9 Score     0    ?  ?Fall Risk ? ?  04/20/2022  ?  1:07 PM 04/17/2022  ?  4:40 PM 06/10/2021  ? 12:57 PM 04/11/2021  ? 12:48 PM 02/09/2020  ?  2:41 PM  ?Fall Risk   ?Falls in the past year? 0 0 1 0 0  ?Number falls in past yr: 0  0 0 0  ?Injury with Fall? 0  1 0 0  ?Risk for fall due to : No Fall Risks      ?Follow up    Falls evaluation completed  Education provided;Falls prevention discussed  ? ? ?FALL RISK PREVENTION PERTAINING TO THE HOME: ? ?Any stairs in or around the home? No  ?If so, are there any without handrails? No  ?Home free of loose th

## 2022-04-20 NOTE — Patient Instructions (Addendum)
?Whitney Beasley , ?Thank you for taking time to come for your Medicare Wellness Visit. I appreciate your ongoing commitment to your health goals. Please review the following plan we discussed and let me know if I can assist you in the future.  ? ?These are the goals we discussed: ? Goals   ? ?   Increase physical activity (pt-stated)   ?   patient   ?   Will consider exercise routine and establish something that works for you long term  ? ?  ?   Patient Stated   ?   Take time to design your exercise program  ? ?  ?   Patient Stated   ?   Develop more muscle tone, make healthier food choices. ? ?  ? ?  ? ? ?Advanced directives: Yes Copies on file ? ?Conditions/risks identified: None ? ?Next appointment: Follow up in one year for your annual wellness visit  ? ? ? ?Preventive Care 80 Years and Older, Female ?Preventive care refers to lifestyle choices and visits with your health care provider that can promote health and wellness. ?What does preventive care include? ?A yearly physical exam. This is also called an annual well check. ?Dental exams once or twice a year. ?Routine eye exams. Ask your health care provider how often you should have your eyes checked. ?Personal lifestyle choices, including: ?Daily care of your teeth and gums. ?Regular physical activity. ?Eating a healthy diet. ?Avoiding tobacco and drug use. ?Limiting alcohol use. ?Practicing safe sex. ?Taking low-dose aspirin every day. ?Taking vitamin and mineral supplements as recommended by your health care provider. ?What happens during an annual well check? ?The services and screenings done by your health care provider during your annual well check will depend on your age, overall health, lifestyle risk factors, and family history of disease. ?Counseling  ?Your health care provider may ask you questions about your: ?Alcohol use. ?Tobacco use. ?Drug use. ?Emotional well-being. ?Home and relationship well-being. ?Sexual activity. ?Eating habits. ?History of  falls. ?Memory and ability to understand (cognition). ?Work and work Statistician. ?Reproductive health. ?Screening  ?You may have the following tests or measurements: ?Height, weight, and BMI. ?Blood pressure. ?Lipid and cholesterol levels. These may be checked every 5 years, or more frequently if you are over 26 years old. ?Skin check. ?Lung cancer screening. You may have this screening every year starting at age 68 if you have a 30-pack-year history of smoking and currently smoke or have quit within the past 15 years. ?Fecal occult blood test (FOBT) of the stool. You may have this test every year starting at age 6. ?Flexible sigmoidoscopy or colonoscopy. You may have a sigmoidoscopy every 5 years or a colonoscopy every 10 years starting at age 54. ?Hepatitis C blood test. ?Hepatitis B blood test. ?Sexually transmitted disease (STD) testing. ?Diabetes screening. This is done by checking your blood sugar (glucose) after you have not eaten for a while (fasting). You may have this done every 1-3 years. ?Bone density scan. This is done to screen for osteoporosis. You may have this done starting at age 83. ?Mammogram. This may be done every 1-2 years. Talk to your health care provider about how often you should have regular mammograms. ?Talk with your health care provider about your test results, treatment options, and if necessary, the need for more tests. ?Vaccines  ?Your health care provider may recommend certain vaccines, such as: ?Influenza vaccine. This is recommended every year. ?Tetanus, diphtheria, and acellular pertussis (Tdap, Td) vaccine.  You may need a Td booster every 10 years. ?Zoster vaccine. You may need this after age 11. ?Pneumococcal 13-valent conjugate (PCV13) vaccine. One dose is recommended after age 36. ?Pneumococcal polysaccharide (PPSV23) vaccine. One dose is recommended after age 38. ?Talk to your health care provider about which screenings and vaccines you need and how often you need  them. ?This information is not intended to replace advice given to you by your health care provider. Make sure you discuss any questions you have with your health care provider. ?Document Released: 12/31/2015 Document Revised: 08/23/2016 Document Reviewed: 10/05/2015 ?Elsevier Interactive Patient Education ? 2017 [employer]. ? ?Fall Prevention in the Home ?Falls can cause injuries. They can happen to people of all ages. There are many things you can do to make your home safe and to help prevent falls. ?What can I do on the outside of my home? ?Regularly fix the edges of walkways and driveways and fix any cracks. ?Remove anything that might make you trip as you walk through a door, such as a raised step or threshold. ?Trim any bushes or trees on the path to your home. ?Use bright outdoor lighting. ?Clear any walking paths of anything that might make someone trip, such as rocks or tools. ?Regularly check to see if handrails are loose or broken. Make sure that both sides of any steps have handrails. ?Any raised decks and porches should have guardrails on the edges. ?Have any leaves, snow, or ice cleared regularly. ?Use sand or salt on walking paths during winter. ?Clean up any spills in your garage right away. This includes oil or grease spills. ?What can I do in the bathroom? ?Use night lights. ?Install grab bars by the toilet and in the tub and shower. Do not use towel bars as grab bars. ?Use non-skid mats or decals in the tub or shower. ?If you need to sit down in the shower, use a plastic, non-slip stool. ?Keep the floor dry. Clean up any water that spills on the floor as soon as it happens. ?Remove soap buildup in the tub or shower regularly. ?Attach bath mats securely with double-sided non-slip rug tape. ?Do not have throw rugs and other things on the floor that can make you trip. ?What can I do in the bedroom? ?Use night lights. ?Make sure that you have a light by your bed that is easy to reach. ?Do not use  any sheets or blankets that are too big for your bed. They should not hang down onto the floor. ?Have a firm chair that has side arms. You can use this for support while you get dressed. ?Do not have throw rugs and other things on the floor that can make you trip. ?What can I do in the kitchen? ?Clean up any spills right away. ?Avoid walking on wet floors. ?Keep items that you use a lot in easy-to-reach places. ?If you need to reach something above you, use a strong step stool that has a grab bar. ?Keep electrical cords out of the way. ?Do not use floor polish or wax that makes floors slippery. If you must use wax, use non-skid floor wax. ?Do not have throw rugs and other things on the floor that can make you trip. ?What can I do with my stairs? ?Do not leave any items on the stairs. ?Make sure that there are handrails on both sides of the stairs and use them. Fix handrails that are broken or loose. Make sure that handrails are as long as  the stairways. ?Check any carpeting to make sure that it is firmly attached to the stairs. Fix any carpet that is loose or worn. ?Avoid having throw rugs at the top or bottom of the stairs. If you do have throw rugs, attach them to the floor with carpet tape. ?Make sure that you have a light switch at the top of the stairs and the bottom of the stairs. If you do not have them, ask someone to add them for you. ?What else can I do to help prevent falls? ?Wear shoes that: ?Do not have high heels. ?Have rubber bottoms. ?Are comfortable and fit you well. ?Are closed at the toe. Do not wear sandals. ?If you use a stepladder: ?Make sure that it is fully opened. Do not climb a closed stepladder. ?Make sure that both sides of the stepladder are locked into place. ?Ask someone to hold it for you, if possible. ?Clearly mark and make sure that you can see: ?Any grab bars or handrails. ?First and last steps. ?Where the edge of each step is. ?Use tools that help you move around (mobility aids)  if they are needed. These include: ?Canes. ?Walkers. ?Scooters. ?Crutches. ?Turn on the lights when you go into a dark area. Replace any light bulbs as soon as they burn out. ?Set up your furniture so you have a

## 2022-05-03 ENCOUNTER — Ambulatory Visit (INDEPENDENT_AMBULATORY_CARE_PROVIDER_SITE_OTHER): Payer: Medicare Other | Admitting: Family Medicine

## 2022-05-03 VITALS — BP 140/88 | HR 71 | Temp 98.4°F | Wt 162.8 lb

## 2022-05-03 DIAGNOSIS — W57XXXA Bitten or stung by nonvenomous insect and other nonvenomous arthropods, initial encounter: Secondary | ICD-10-CM

## 2022-05-03 DIAGNOSIS — S70362A Insect bite (nonvenomous), left thigh, initial encounter: Secondary | ICD-10-CM | POA: Diagnosis not present

## 2022-05-03 MED ORDER — CEPHALEXIN 500 MG PO CAPS: 500.0000 mg | ORAL_CAPSULE | Freq: Two times a day (BID) | ORAL | 0 refills | Status: AC

## 2022-05-03 NOTE — Progress Notes (Signed)
Subjective:  ? ? Patient ID: Whitney Beasley, female    DOB: 1946-01-10, 76 y.o.   MRN: [id number] ? ?Chief Complaint  ?Patient presents with  ? Tick Removal  ?  It fell off this morning, was not there yday morning when showered. Spot is red.   ? ? ?HPI ?Patient was seen today for acute concern.  Patient endorses removing a tick from her left upper medial thigh this morning.  Pt was doing work in [place] yesterday[place]  [place]  H/o minocycline allergy.  Pt denies fever, chills, joint pain, nausea, vomiting.  Pt has tick in a container.  ? ?Past Medical History:  ?Diagnosis Date  ? Allergy   ? Asthma   ? as child  ? Depression   ? GERD (gastroesophageal reflux disease)   ? Ulcerative colitis ([place])   ? UTI (urinary tract infection)   ? ? ?Allergies  ?Allergen Reactions  ? Minocycline Other (See Comments)  ?  Joint pain and Fatigue, increase liver enzymes   ? ? ?ROS ?General: Denies fever, chills, night sweats, changes in weight, changes in appetite ?HEENT: Denies headaches, ear pain, changes in vision, rhinorrhea, sore throat ?CV: Denies CP, palpitations, SOB, orthopnea ?Pulm: Denies SOB, cough, wheezing ?GI: Denies abdominal pain, nausea, vomiting, diarrhea, constipation ?GU: Denies dysuria, hematuria, frequency, vaginal discharge ?Msk: Denies muscle cramps, joint pains ?Neuro: Denies weakness, numbness, tingling ?Skin: Denies rashes, bruising +tick bite ?Psych: Denies depression, anxiety, hallucinations ? ?   ?Objective:  ?  ?Blood pressure 140/88, pulse 71, temperature 98.4 ?F (36.9 ?C), temperature source Oral, weight 162 lb 12.8 oz (73.8 kg), SpO2 99 %. ? ?Gen. Pleasant, well-nourished, in no distress, normal affect   ?HEENT: [place]/AT, face symmetric, conjunctiva clear, no scleral icterus, PERRLA, EOMI, nares patent without drainage ?Lungs: no accessory muscle use ?Cardiovascular: RRR,  no peripheral edema ?Musculoskeletal: No deformities, no cyanosis or clubbing, normal tone ?Neuro:  A&Ox3, CN II-XII  intact, normal gait ?Skin:  Warm, dry, intact.  Left upper medial thigh with a raised erythematous area approximately 5 mm in diameter.  No induration, increased warmth, remaining insect parts noted. ? ? ?Wt Readings from Last 3 Encounters:  ?05/03/22 162 lb 12.8 oz (73.8 kg)  ?04/20/22 164 lb (74.4 kg)  ?06/10/21 166 lb (75.3 kg)  ? ? ?Lab Results  ?Component Value Date  ? WBC 6.0 06/10/2021  ? HGB 13.7 06/10/2021  ? HCT 40.3 06/10/2021  ? PLT 225.0 06/10/2021  ? GLUCOSE 121 (H) 06/10/2021  ? CHOL 172 10/05/2021  ? TRIG 106.0 10/05/2021  ? HDL 70.30 10/05/2021  ? LDLDIRECT 136.0 06/10/2021  ? [place] 81 10/05/2021  ? ALT 19 06/10/2021  ? AST 23 06/10/2021  ? NA 140 06/10/2021  ? K 4.3 06/10/2021  ? CL 105 06/10/2021  ? CREATININE 0.88 06/10/2021  ? BUN 20 06/10/2021  ? CO2 25 06/10/2021  ? TSH 3.01 04/15/2018  ? ? ?Assessment/Plan: ? ?Tick bite of left thigh, initial encounter  ?-Tick present less than 24 hours. ?-Avoid Doxycycline 2/2 allergy to minocycline.  Questionable efficacy of other antibiotics in ppx for tick borne illness.  Will start Keflex 500 mg twice daily.  Per further chart review it appears patient may have taken doxycycline in the past without issue. R/b/a considered. ?-given handout ?-given strict precautions ?- Plan: cephALEXin (KEFLEX) 500 MG capsule ? ?F/u prn ? ?Grier Mitts, MD ?

## 2022-05-11 DIAGNOSIS — L570 Actinic keratosis: Secondary | ICD-10-CM | POA: Diagnosis not present

## 2022-05-11 DIAGNOSIS — L820 Inflamed seborrheic keratosis: Secondary | ICD-10-CM | POA: Diagnosis not present

## 2022-05-11 DIAGNOSIS — L821 Other seborrheic keratosis: Secondary | ICD-10-CM | POA: Diagnosis not present

## 2022-05-26 ENCOUNTER — Other Ambulatory Visit: Payer: Self-pay | Admitting: Pediatric Radiology

## 2022-05-26 ENCOUNTER — Other Ambulatory Visit: Payer: Self-pay | Admitting: Adult Health

## 2022-05-26 DIAGNOSIS — Z1231 Encounter for screening mammogram for malignant neoplasm of breast: Secondary | ICD-10-CM

## 2022-06-01 ENCOUNTER — Other Ambulatory Visit: Payer: Self-pay | Admitting: *Deleted

## 2022-06-01 MED ORDER — ROSUVASTATIN CALCIUM 5 MG PO TABS: 5.0000 mg | ORAL_TABLET | Freq: Every day | ORAL | 0 refills | Status: DC

## 2022-06-14 ENCOUNTER — Encounter: Payer: Medicare Other | Admitting: Family Medicine

## 2022-06-14 DIAGNOSIS — M8588 Other specified disorders of bone density and structure, other site: Secondary | ICD-10-CM | POA: Diagnosis not present

## 2022-06-16 ENCOUNTER — Encounter: Payer: Self-pay | Admitting: Family

## 2022-06-16 ENCOUNTER — Ambulatory Visit (INDEPENDENT_AMBULATORY_CARE_PROVIDER_SITE_OTHER): Payer: Medicare Other | Admitting: Family

## 2022-06-16 VITALS — BP 138/82 | HR 75 | Temp 98.0°F | Ht 65.25 in | Wt 160.3 lb

## 2022-06-16 DIAGNOSIS — E785 Hyperlipidemia, unspecified: Secondary | ICD-10-CM | POA: Diagnosis not present

## 2022-06-16 DIAGNOSIS — Z131 Encounter for screening for diabetes mellitus: Secondary | ICD-10-CM

## 2022-06-16 DIAGNOSIS — E559 Vitamin D deficiency, unspecified: Secondary | ICD-10-CM | POA: Diagnosis not present

## 2022-06-16 DIAGNOSIS — Z0000 Encounter for general adult medical examination without abnormal findings: Secondary | ICD-10-CM | POA: Diagnosis not present

## 2022-06-16 DIAGNOSIS — E1169 Type 2 diabetes mellitus with other specified complication: Secondary | ICD-10-CM | POA: Diagnosis not present

## 2022-06-16 LAB — CBC WITH DIFFERENTIAL/PLATELET
Basophils Absolute: 0.1 10*3/uL (ref 0.0–0.1)
Basophils Relative: 1.8 % (ref 0.0–3.0)
Eosinophils Absolute: 0.2 10*3/uL (ref 0.0–0.7)
Eosinophils Relative: 4.2 % (ref 0.0–5.0)
HCT: 41.6 % (ref 36.0–46.0)
Hemoglobin: 13.8 g/dL (ref 12.0–15.0)
Lymphocytes Relative: 33.8 % (ref 12.0–46.0)
Lymphs Abs: 1.8 10*3/uL (ref 0.7–4.0)
MCHC: 33.1 g/dL (ref 30.0–36.0)
MCV: 95.4 fl (ref 78.0–100.0)
Monocytes Absolute: 0.6 10*3/uL (ref 0.1–1.0)
Monocytes Relative: 10.3 % (ref 3.0–12.0)
Neutro Abs: 2.7 10*3/uL (ref 1.4–7.7)
Neutrophils Relative %: 49.9 % (ref 43.0–77.0)
Platelets: 204 10*3/uL (ref 150.0–400.0)
RBC: 4.36 Mil/uL (ref 3.87–5.11)
RDW: 13.5 % (ref 11.5–15.5)
WBC: 5.4 10*3/uL (ref 4.0–10.5)

## 2022-06-16 LAB — LIPID PANEL
Cholesterol: 183 mg/dL (ref 0–200)
HDL: 75.1 mg/dL (ref >39.00)
LDL Cholesterol: 90 mg/dL (ref 0–99)
NonHDL: 107.51
Total CHOL/HDL Ratio: 2
Triglycerides: 89 mg/dL (ref 0.0–149.0)
VLDL: 17.8 mg/dL (ref 0.0–40.0)

## 2022-06-16 LAB — COMPREHENSIVE METABOLIC PANEL
ALT: 19 U/L (ref 0–35)
AST: 22 U/L (ref 0–37)
Albumin: 4.4 g/dL (ref 3.5–5.2)
Alkaline Phosphatase: 74 U/L (ref 39–117)
BUN: 14 mg/dL (ref 6–23)
CO2: 28 mEq/L (ref 19–32)
Calcium: 9.9 mg/dL (ref 8.4–10.5)
Chloride: 105 mEq/L (ref 96–112)
Creatinine, Ser: 0.9 mg/dL (ref 0.40–1.20)
GFR: 63.19 mL/min (ref >60.00)
Glucose, Bld: 97 mg/dL (ref 70–99)
Potassium: 4.3 mEq/L (ref 3.5–5.1)
Sodium: 142 mEq/L (ref 135–145)
Total Bilirubin: 0.6 mg/dL (ref 0.2–1.2)
Total Protein: 6.9 g/dL (ref 6.0–8.3)

## 2022-06-16 LAB — VITAMIN D 25 HYDROXY (VIT D DEFICIENCY, FRACTURES): VITD: 68.14 ng/mL (ref 30.00–100.00)

## 2022-06-16 MED ORDER — ROSUVASTATIN CALCIUM 5 MG PO TABS: 5.0000 mg | ORAL_TABLET | Freq: Every day | ORAL | 3 refills | Status: DC

## 2022-06-16 NOTE — Progress Notes (Signed)
Complete physical exam  Patient: Whitney Beasley   DOB: May 13, 1946   76 y.o. Female  MRN: [id number]  Subjective:    Chief Complaint  Patient presents with  . Annual Exam    Whitney Beasley is a 76 y.o. female who presents today for a complete physical exam. She reports consuming a general diet. The patient does not participate in regular exercise at present. She generally feels well. She reports sleeping well. She does not have additional problems to discuss today.    Most recent fall risk assessment:    06/16/2022   10:00 AM  [place] in the past year? 0  Number falls in past yr: 0  Injury with Fall? 0  Risk for fall due to : No Fall Risks  Follow up Falls evaluation completed     Most recent depression screenings:    05/03/2022    1:30 PM 04/20/2022    1:03 PM  PHQ 2/9 Scores  PHQ - 2 Score 0 0  PHQ- 9 Score 0     Dental: No current dental problems and Receives regular dental care  Patient Active Problem List   Diagnosis Date Noted  . Ulcerative colitis ([place]) 03/30/2017  . Osteoporosis 03/30/2017  . Adjustment disorder with mixed anxiety and depressed mood 03/30/2017   Past Medical History:  Diagnosis Date  . Allergy   . Asthma    as child  . Depression   . GERD (gastroesophageal reflux disease)   . Ulcerative colitis ([place])   . UTI (urinary tract infection)    Past Surgical History:  Procedure Laterality Date  . bone spur left pinky finger under local  07/30/2019  . COLONOSCOPY  04/01/2014, 2013   2015 normal exam,  in [place]  . TONSILLECTOMY Bilateral 1950's   Allergies  Allergen Reactions  . Minocycline Other (See Comments)    Joint pain and Fatigue, increase liver enzymes       Patient Care Team: Caren Macadam, MD (Inactive) as PCP - General (Family Medicine) Marylynn Pearson, MD as Consulting Physician (Obstetrics and Gynecology) Druscilla Brownie, MD as Referring Physician (Dermatology)   Outpatient Medications Prior to  Visit  Medication Sig  . B Complex-C (SUPER B COMPLEX PO) Take 1 tablet by mouth daily.  . calcium carbonate 200 MG capsule Take 1,600 mg by mouth daily.   . Glucosamine-Chondroitin 750-600 MG TABS Take 1 tablet by mouth daily.  . Multiple Vitamin (MULTIVITAMIN) tablet Take 1 tablet by mouth daily.  . rosuvastatin (CRESTOR) 5 MG tablet Take 1 tablet (5 mg total) by mouth daily. (Patient taking differently: Take 5 mg by mouth 3 (three) times a week.)  . tretinoin (RETIN-A) 0.025 % gel Apply topically as needed.  . venlafaxine XR (EFFEXOR-XR) 37.5 MG 24 hr capsule TAKE 1 CAPSULE BY MOUTH 3  TIMES WEEKLY  . VITAMIN D PO Take 1,600 mg by mouth daily.  Marland Kitchen zinc gluconate 50 MG tablet Take 50 mg by mouth daily.   No facility-administered medications prior to visit.    Review of Systems  All other systems reviewed and are negative.        Objective:     BP 138/82 (BP Location: Left Arm, Patient Position: Sitting, Cuff Size: Normal)   Pulse 75   Temp 98 F (36.7 C) (Oral)   Ht 5' 5.25" (1.657 m)   Wt 160 lb 4.8 oz (72.7 kg)   LMP  (LMP Unknown) Comment: 2005  SpO2 97%  BMI 26.47 kg/m  BP Readings from Last 3 Encounters:  06/16/22 138/82  05/03/22 140/88  06/10/21 128/80      Physical Exam Vitals and nursing note reviewed.  Constitutional:      Appearance: Normal appearance.  HENT:     Head: Normocephalic and atraumatic.     Right Ear: Tympanic membrane, ear canal and external ear normal.     Left Ear: Tympanic membrane, ear canal and external ear normal.     Nose: Nose normal.     Mouth/Throat:     Mouth: Mucous membranes are moist.  Cardiovascular:     Rate and Rhythm: Normal rate and regular rhythm.     Pulses: Normal pulses.     Heart sounds: Normal heart sounds.  Pulmonary:     Effort: Pulmonary effort is normal.     Breath sounds: Normal breath sounds.  Abdominal:     General: Abdomen is flat. Bowel sounds are normal.     Palpations: Abdomen is soft.      Tenderness: There is no guarding or rebound.  Musculoskeletal:        General: Normal range of motion.     Cervical back: Normal range of motion and neck supple.  Skin:    General: Skin is warm and dry.  Neurological:     General: No focal deficit present.     Mental Status: She is alert.  Psychiatric:        Mood and Affect: Mood normal.        Behavior: Behavior normal.     No results found for any visits on 06/16/22. Last CBC Lab Results  Component Value Date   WBC 6.0 06/10/2021   HGB 13.7 06/10/2021   HCT 40.3 06/10/2021   MCV 92.8 06/10/2021   RDW 13.7 06/10/2021   PLT 225.0 06/10/2021   Last vitamin B12 and Folate No results found for: "VITAMINB12", "FOLATE"      Assessment & Plan:    Routine Health Maintenance and Physical Exam  Immunization History  Administered Date(s) Administered  . Fluad Quad(high Dose 65+) 10/05/2021  . Influenza, High Dose Seasonal PF 10/08/2014, 10/02/2016, 10/08/2017, 09/19/2018  . Influenza-Unspecified 09/17/2017, 08/19/2019  . Moderna Sars-Covid-2 Vaccination 04/21/2021  . PFIZER Comirnaty(Gray Top)Covid-19 Tri-Sucrose Vaccine 01/19/2020, 02/17/2020  . Pneumococcal Conjugate-13 05/07/2014  . Pneumococcal Polysaccharide-23 03/30/2017  . Pneumococcal-Unspecified 01/18/2017  . Td 12/23/2007  . Tdap 06/30/2021  . Zoster Recombinat (Shingrix) 05/27/2018, 07/30/2018  . Zoster, Live 12/02/2009, 01/18/2017    Health Maintenance  Topic Date Due  . COVID-19 Vaccine (4 - Pfizer series) 07/02/2022 (Originally 06/16/2021)  . MAMMOGRAM  06/28/2022  . INFLUENZA VACCINE  07/18/2022  . COLONOSCOPY (Pts 45-38yr Insurance coverage will need to be confirmed)  08/04/2024  . TETANUS/TDAP  07/01/2031  . Pneumonia Vaccine 74 Years old  Completed  . DEXA SCAN  Completed  . Hepatitis C Screening  Completed  . Zoster Vaccines- Shingrix  Completed  . HPV VACCINES  Aged Out    Discussed health benefits of physical activity, and encouraged her to  engage in regular exercise appropriate for her age and condition.  Problem List Items Addressed This Visit   None Visit Diagnoses     Screening for diabetes mellitus    -  Primary   Routine general medical examination at a health care facility       Relevant Orders   CBC with Differential   Comprehensive metabolic panel   Lipid panel   Vitamin D deficiency  Relevant Orders   Vitamin D, 25-hydroxy   Hyperlipidemia associated with type 2 diabetes mellitus ([place])          Return in 1 year (on 06/17/2023). Encouraged a healthy diet, exercise, monthly self breast exams. Follow-up in 6 months and sooner as needed.     Kennyth Arnold, FNP

## 2022-06-16 NOTE — Patient Instructions (Signed)
Health Maintenance, Female Adopting a healthy lifestyle and getting preventive care are important in promoting health and wellness. Ask your health care provider about: The right schedule for you to have regular tests and exams. Things you can do on your own to prevent diseases and keep yourself healthy. What should I know about diet, weight, and exercise? Eat a healthy diet  Eat a diet that includes plenty of vegetables, fruits, low-fat dairy products, and lean protein. Do not eat a lot of foods that are high in solid fats, added sugars, or sodium. Maintain a healthy weight Body mass index (BMI) is used to identify weight problems. It estimates body fat based on height and weight. Your health care provider can help determine your BMI and help you achieve or maintain a healthy weight. Get regular exercise Get regular exercise. This is one of the most important things you can do for your health. Most adults should: Exercise for at least 150 minutes each week. The exercise should increase your heart rate and make you sweat (moderate-intensity exercise). Do strengthening exercises at least twice a week. This is in addition to the moderate-intensity exercise. Spend less time sitting. Even light physical activity can be beneficial. Watch cholesterol and blood lipids Have your blood tested for lipids and cholesterol at 74 years of age, then have this test every 5 years. Have your cholesterol levels checked more often if: Your lipid or cholesterol levels are high. You are older than 74 years of age. You are at high risk for heart disease. What should I know about cancer screening? Depending on your health history and family history, you may need to have cancer screening at various ages. This may include screening for: Breast cancer. Cervical cancer. Colorectal cancer. Skin cancer. Lung cancer. What should I know about heart disease, diabetes, and high blood pressure? Blood pressure and heart  disease High blood pressure causes heart disease and increases the risk of stroke. This is more likely to develop in people who have high blood pressure readings or are overweight. Have your blood pressure checked: Every 3-5 years if you are 89-22 years of age. Every year if you are 59 years old or older. Diabetes Have regular diabetes screenings. This checks your fasting blood sugar level. Have the screening done: Once every three years after age 8 if you are at a normal weight and have a low risk for diabetes. More often and at a younger age if you are overweight or have a high risk for diabetes. What should I know about preventing infection? Hepatitis B If you have a higher risk for hepatitis B, you should be screened for this virus. Talk with your health care provider to find out if you are at risk for hepatitis B infection. Hepatitis C Testing is recommended for: Everyone born from [birth year] through 1965. Anyone with known risk factors for hepatitis C. Sexually transmitted infections (STIs) Get screened for STIs, including gonorrhea and chlamydia, if: You are sexually active and are younger than 74 years of age. You are older than 74 years of age and your health care provider tells you that you are at risk for this type of infection. Your sexual activity has changed since you were last screened, and you are at increased risk for chlamydia or gonorrhea. Ask your health care provider if you are at risk. Ask your health care provider about whether you are at high risk for HIV. Your health care provider may recommend a prescription medicine to help prevent HIV  infection. If you choose to take medicine to prevent HIV, you should first get tested for HIV. You should then be tested every 3 months for as long as you are taking the medicine. Pregnancy If you are about to stop having your period (premenopausal) and you may become pregnant, seek counseling before you get pregnant. Take 400 to 800  micrograms (mcg) of folic acid every day if you become pregnant. Ask for birth control (contraception) if you want to prevent pregnancy. Osteoporosis and menopause Osteoporosis is a disease in which the bones lose minerals and strength with aging. This can result in bone fractures. If you are 90 years old or older, or if you are at risk for osteoporosis and fractures, ask your health care provider if you should: Be screened for bone loss. Take a calcium or vitamin D supplement to lower your risk of fractures. Be given hormone replacement therapy (HRT) to treat symptoms of menopause. Follow these instructions at home: Alcohol use Do not drink alcohol if: Your health care provider tells you not to drink. You are pregnant, may be pregnant, or are planning to become pregnant. If you drink alcohol: Limit how much you have to: 0-1 drink a day. Know how much alcohol is in your drink. In the [place] one drink equals one 12 oz bottle of beer (355 mL), one 5 oz glass of wine (148 mL), or one 1 oz glass of hard liquor (44 mL). Lifestyle Do not use any products that contain nicotine or tobacco. These products include cigarettes, chewing tobacco, and vaping devices, such as e-cigarettes. If you need help quitting, ask your health care provider. Do not use street drugs. Do not share needles. Ask your health care provider for help if you need support or information about quitting drugs. General instructions Schedule regular health, dental, and eye exams. Stay current with your vaccines. Tell your health care provider if: You often feel depressed. You have ever been abused or do not feel safe at home. Summary Adopting a healthy lifestyle and getting preventive care are important in promoting health and wellness. Follow your health care provider's instructions about healthy diet, exercising, and getting tested or screened for diseases. Follow your health care provider's instructions on monitoring your  cholesterol and blood pressure. This information is not intended to replace advice given to you by your health care provider. Make sure you discuss any questions you have with your health care provider. Document Revised: 04/25/2021 Document Reviewed: 04/25/2021 Elsevier Patient Education  [place].     Why follow it? Research shows. Those who follow the Mediterranean diet have a reduced risk of heart disease  The diet is associated with a reduced incidence of Parkinson's and Alzheimer's diseases People following the diet may have longer life expectancies and lower rates of chronic diseases  The Dietary Guidelines for Americans recommends the Mediterranean diet as an eating plan to promote health and prevent disease  What Is the Mediterranean Diet?  Healthy eating plan based on typical foods and recipes of Mediterranean-style cooking The diet is primarily a plant based diet; these foods should make up a majority of meals   Starches - Plant based foods should make up a majority of meals - They are an important sources of vitamins, minerals, energy, antioxidants, and fiber - Choose whole grains, foods high in fiber and minimally processed items  - Typical grain sources include wheat, oats, barley, corn, brown rice, bulgar, farro, millet, polenta, couscous  - Various types of beans  include chickpeas, lentils, fava beans, black beans, white beans   Fruits  Veggies - Large quantities of antioxidant rich fruits & veggies; 6 or more servings  - Vegetables can be eaten raw or lightly drizzled with oil and cooked  - Vegetables common to the traditional Mediterranean Diet include: artichokes, arugula, beets, broccoli, brussel sprouts, cabbage, carrots, celery, collard greens, cucumbers, eggplant, kale, leeks, lemons, lettuce, mushrooms, okra, onions, peas, peppers, potatoes, pumpkin, radishes, rutabaga, shallots, spinach, sweet potatoes, turnips, zucchini - Fruits common to the Mediterranean  Diet include: apples, apricots, avocados, cherries, clementines, dates, figs, grapefruits, grapes, melons, nectarines, oranges, peaches, pears, pomegranates, strawberries, tangerines  Fats - Replace butter and margarine with healthy oils, such as olive oil, canola oil, and tahini  - Limit nuts to no more than a handful a day  - Nuts include walnuts, almonds, pecans, pistachios, pine nuts  - Limit or avoid candied, honey roasted or heavily salted nuts - Olives are central to the [employer] - can be eaten whole or used in a variety of dishes   Meats Protein - Limiting red meat: no more than a few times a month - When eating red meat: choose lean cuts and keep the portion to the size of deck of cards - Eggs: approx. 0 to 4 times a week  - Fish and lean poultry: at least 2 a week  - Healthy protein sources include, chicken, [place], lean beef, lamb - Increase intake of seafood such as tuna, salmon, trout, mackerel, shrimp, scallops - Avoid or limit high fat processed meats such as sausage and bacon  Dairy - Include moderate amounts of low fat dairy products  - Focus on healthy dairy such as fat free yogurt, skim milk, low or reduced fat cheese - Limit dairy products higher in fat such as whole or 2% milk, cheese, ice cream  Alcohol - Moderate amounts of red wine is ok  - No more than 5 oz daily for women (all ages) and men older than age 41  - No more than 10 oz of wine daily for men younger than 50  Other - Limit sweets and other desserts  - Use herbs and spices instead of salt to flavor foods  - Herbs and spices common to the traditional Mediterranean Diet include: basil, bay leaves, chives, cloves, cumin, fennel, garlic, lavender, marjoram, mint, oregano, parsley, pepper, rosemary, sage, savory, sumac, tarragon, thyme   It's not just a diet, it's a lifestyle:  The Mediterranean diet includes lifestyle factors typical of those in the region  Foods, drinks and meals are best eaten with  others and savored Daily physical activity is important for overall good health This could be strenuous exercise like running and aerobics This could also be more leisurely activities such as walking, housework, yard-work, or taking the stairs Moderation is the key; a balanced and healthy diet accommodates most foods and drinks Consider portion sizes and frequency of consumption of certain foods   Meal Ideas & Options:  Breakfast:  Whole wheat toast or whole wheat English muffins with peanut butter & hard boiled egg Steel cut oats topped with apples & cinnamon and skim milk  Fresh fruit: banana, strawberries, melon, berries, peaches  Smoothies: strawberries, bananas, greek yogurt, peanut butter Low fat greek yogurt with blueberries and granola  Egg white omelet with spinach and mushrooms Breakfast couscous: whole wheat couscous, apricots, skim milk, cranberries  Sandwiches:  Hummus and grilled vegetables (peppers, zucchini, squash) on whole wheat bread   Grilled  chicken on whole wheat pita with lettuce, tomatoes, cucumbers or tzatziki  [place] salad on whole wheat bread: tuna salad made with greek yogurt, olives, red peppers, capers, green onions Garlic rosemary lamb pita: lamb sauted with garlic, rosemary, salt & pepper; add lettuce, cucumber, greek yogurt to pita - flavor with lemon juice and black pepper  Seafood:  Mediterranean grilled salmon, seasoned with garlic, basil, parsley, lemon juice and black pepper Shrimp, lemon, and spinach whole-grain pasta salad made with low fat greek yogurt  Seared scallops with lemon orzo  Seared tuna steaks seasoned salt, pepper, coriander topped with tomato mixture of olives, tomatoes, olive oil, minced garlic, parsley, green onions and cappers  Meats:  Herbed greek chicken salad with kalamata olives, cucumber, feta  Red bell peppers stuffed with spinach, bulgur, lean ground beef (or lentils) & topped with feta   Kebabs: skewers of chicken, tomatoes,  onions, zucchini, squash  [place] burgers: made with red onions, mint, dill, lemon juice, feta cheese topped with roasted red peppers Vegetarian Cucumber salad: cucumbers, artichoke hearts, celery, red onion, feta cheese, tossed in olive oil & lemon juice  Hummus and whole grain pita points with a greek salad (lettuce, tomato, feta, olives, cucumbers, red onion) Lentil soup with celery, carrots made with vegetable broth, garlic, salt and pepper  Tabouli salad: parsley, bulgur, mint, scallions, cucumbers, tomato, radishes, lemon juice, olive oil, salt and pepper.

## 2022-07-03 ENCOUNTER — Encounter: Payer: Self-pay | Admitting: Family Medicine

## 2022-07-04 ENCOUNTER — Ambulatory Visit: Payer: Medicare Other

## 2022-07-05 ENCOUNTER — Ambulatory Visit
Admission: RE | Admit: 2022-07-05 | Discharge: 2022-07-05 | Disposition: A | Discharge status: Home / Self Care | Payer: Medicare Other | Source: Ambulatory Visit | Attending: Adult Health | Admitting: Adult Health

## 2022-07-05 DIAGNOSIS — M8580 Other specified disorders of bone density and structure, unspecified site: Secondary | ICD-10-CM | POA: Diagnosis not present

## 2022-07-05 DIAGNOSIS — Z1231 Encounter for screening mammogram for malignant neoplasm of breast: Secondary | ICD-10-CM

## 2022-08-08 ENCOUNTER — Ambulatory Visit (INDEPENDENT_AMBULATORY_CARE_PROVIDER_SITE_OTHER): Payer: Medicare Other | Admitting: Family Medicine

## 2022-08-08 ENCOUNTER — Encounter: Payer: Self-pay | Admitting: Family Medicine

## 2022-08-08 DIAGNOSIS — J309 Allergic rhinitis, unspecified: Secondary | ICD-10-CM

## 2022-08-08 DIAGNOSIS — F4323 Adjustment disorder with mixed anxiety and depressed mood: Secondary | ICD-10-CM | POA: Diagnosis not present

## 2022-08-08 MED ORDER — VENLAFAXINE HCL ER 37.5 MG PO CP24: ORAL_CAPSULE | ORAL | 3 refills | Status: DC

## 2022-08-08 NOTE — Progress Notes (Unsigned)
Established Patient Office Visit  Subjective   Patient ID: Whitney Beasley, female    DOB: 1946/07/26  Age: 76 y.o. MRN: [id number]  Chief Complaint  Patient presents with   Establish Care   Medication Refill    Patient requests refills on Venalafaxine    Patient is here for transition of care visit.  Patient is reporting 1 month history of increased coughing and her chest feeling tight, a little more mucus production. Patient reports that she has had seasonal allergies in the past. Seemed to be worse this month.   Patient reports she just recently had her DEXA scan at [facility] last month, was told that the results were "fine".  Adjustment disorder-- Pt is taking the effexor 37.5 mg three times a week. States it is working well for her, she denies any side effects. States she has been on this medication for several years and is not sure if it is still needed but states that she takes it more for security. We discussed how to wean off the medication safely if she ever decides she wants to try stopping the medication.    Patient Active Problem List   Diagnosis Date Noted   Allergic rhinitis 08/08/2022   Ulcerative colitis ([place]) 03/30/2017   Osteoporosis 03/30/2017   Adjustment disorder with mixed anxiety and depressed mood 03/30/2017      Review of Systems  Constitutional:  Negative for chills, diaphoresis, fever and weight loss.  Respiratory:  Positive for cough. Negative for hemoptysis and wheezing.   Cardiovascular:  Negative for chest pain, palpitations and leg swelling.  All other systems reviewed and are negative.     Objective:     BP 116/80 (BP Location: Left Arm, Patient Position: Sitting, Cuff Size: Normal)   Pulse 80   Temp 98.1 F (36.7 C) (Oral)   Ht 5' 5.25" (1.657 m)   Wt 165 lb 8 oz (75.1 kg)   LMP  (LMP Unknown) Comment: 2005  SpO2 98%   BMI 27.33 kg/m  BP Readings from Last 3 Encounters:  08/08/22 116/80  06/16/22 138/82  05/03/22  140/88   Wt Readings from Last 3 Encounters:  08/08/22 165 lb 8 oz (75.1 kg)  06/16/22 160 lb 4.8 oz (72.7 kg)  05/03/22 162 lb 12.8 oz (73.8 kg)      Physical Exam Vitals reviewed.  Constitutional:      Appearance: Normal appearance. She is well-groomed and normal weight.  HENT:     Head: Normocephalic and atraumatic.  Cardiovascular:     Rate and Rhythm: Normal rate and regular rhythm.     Heart sounds: S1 normal and S2 normal.  Pulmonary:     Effort: Pulmonary effort is normal.     Breath sounds: Normal breath sounds and air entry. No wheezing, rhonchi or rales.  Abdominal:     General: Bowel sounds are normal.  Musculoskeletal:        General: Normal range of motion.     Right lower leg: No edema.     Left lower leg: No edema.  Skin:    General: Skin is warm and dry.  Neurological:     Mental Status: She is alert and oriented to person, place, and time. Mental status is at baseline.     Gait: Gait is intact.  Psychiatric:        Mood and Affect: Mood and affect normal.        Speech: Speech normal.  Behavior: Behavior normal.        Judgment: Judgment normal.      No results found for any visits on 08/08/22.  Last metabolic panel Lab Results  Component Value Date   GLUCOSE 97 06/16/2022   NA 142 06/16/2022   K 4.3 06/16/2022   CL 105 06/16/2022   CO2 28 06/16/2022   BUN 14 06/16/2022   CREATININE 0.90 06/16/2022   CALCIUM 9.9 06/16/2022   PROT 6.9 06/16/2022   ALBUMIN 4.4 06/16/2022   BILITOT 0.6 06/16/2022   ALKPHOS 74 06/16/2022   AST 22 06/16/2022   ALT 19 06/16/2022   Last lipids Lab Results  Component Value Date   CHOL 183 06/16/2022   HDL 75.10 06/16/2022   LDLCALC 90 06/16/2022   LDLDIRECT 136.0 06/10/2021   TRIG 89.0 06/16/2022   CHOLHDL 2 06/16/2022      The 10-year ASCVD risk score (Arnett DK, et al., 2019) is: 21.7%    Assessment & Plan:   Problem List Items Addressed This Visit       Respiratory   Allergic  rhinitis     Other   Adjustment disorder with mixed anxiety and depressed mood   Relevant Medications   venlafaxine XR (EFFEXOR-XR) 37.5 MG 24 hr capsule    Return in about 1 year (around 08/09/2023) for follow up annual visit.    Farrel Conners, MD

## 2022-08-09 NOTE — Assessment & Plan Note (Signed)
Sx are well controlled on the effexor 37.5 mg capsules 3 times a week. We discussed that it she would like to try stopping the medication she can do so safely and monitor her mood sx to see if they return off the medication. Pt states she will think about it

## 2022-08-09 NOTE — Assessment & Plan Note (Signed)
Lungs are clear on exam today, I recommended OTC use of an antihistamine such as claritin or zyrtec 10 mg daily to help with her symptoms. She has no chest pain, dizziness or other red flag symptoms at this time.

## 2022-09-18 ENCOUNTER — Encounter: Payer: Self-pay | Admitting: Family Medicine

## 2022-12-22 ENCOUNTER — Ambulatory Visit (INDEPENDENT_AMBULATORY_CARE_PROVIDER_SITE_OTHER): Payer: Medicare Other | Admitting: Family Medicine

## 2022-12-22 ENCOUNTER — Encounter: Payer: Self-pay | Admitting: Family Medicine

## 2022-12-22 VITALS — BP 136/88 | HR 70 | Temp 98.0°F | Wt 162.8 lb

## 2022-12-22 DIAGNOSIS — J018 Other acute sinusitis: Secondary | ICD-10-CM

## 2022-12-22 MED ORDER — AMOXICILLIN-POT CLAVULANATE 500-125 MG PO TABS: 1.0000 | ORAL_TABLET | Freq: Two times a day (BID) | ORAL | 0 refills | Status: AC

## 2022-12-22 NOTE — Progress Notes (Signed)
   Acute Office Visit  Subjective:     Patient ID: Whitney Beasley, female    DOB: [birth year], 77 y.o.   MRN: [id number]  Chief Complaint  Patient presents with   Nasal Congestion    Pt reports sx started as sore throat on 12/21 then develop sx of headache, cough, neasea, fatigue. Denied fever. Was blowing out green/orange phlegms. She reports sx subsided. But having nasal congestion, nausea and fatigue. Took a generic of Nyquil.     HPI Patient is in today for ongoing concern.  Patient endorses sore throat, nasal congestion, cough starting 12/07/2022.  Patient started feeling better 12/18/2022 then symptoms returned.  Patient now with fatigue, facial pressure, dull headache.  Tried generic NyQuil.  Uses saline nasal rinse regularly.  ROS Positive symptoms as stated above     Objective:    BP 136/88 (BP Location: Right Arm, Patient Position: Sitting, Cuff Size: Normal)   Pulse 70   Temp 98 F (36.7 C) (Oral)   Wt 162 lb 12.8 oz (73.8 kg)   LMP  (LMP Unknown) Comment: 2005  SpO2 96%   BMI 26.88 kg/m    Physical Exam Constitutional:      Appearance: Normal appearance.  HENT:     Head: Normocephalic and atraumatic.     Comments: No TTP of maxillary, frontal, or ethmoid sinuses.    Right Ear: Tympanic membrane, ear canal and external ear normal.     Left Ear: Tympanic membrane, ear canal and external ear normal.     Ears:     Comments: Cerumen partially occluded right canal.    Nose: Nose normal.     Mouth/Throat:     Mouth: Mucous membranes are moist.  Eyes:     Extraocular Movements: Extraocular movements intact.     Conjunctiva/sclera: Conjunctivae normal.     Pupils: Pupils are equal, round, and reactive to light.  Cardiovascular:     Rate and Rhythm: Normal rate and regular rhythm.     Pulses: Normal pulses.     Heart sounds: Normal heart sounds.  Pulmonary:     Effort: Pulmonary effort is normal.     Breath sounds: Normal breath sounds. No wheezing, rhonchi or  rales.  Musculoskeletal:     Cervical back: Normal range of motion.  Lymphadenopathy:     Cervical: No cervical adenopathy.  Neurological:     Mental Status: She is alert.     No results found for any visits on 12/22/22.      Assessment & Plan:   Problem List Items Addressed This Visit   None Visit Diagnoses     Other acute sinusitis, recurrence not specified    -  Primary   Relevant Medications   amoxicillin-clavulanate (AUGMENTIN) 500-125 MG tablet       Meds ordered this encounter  Medications   amoxicillin-clavulanate (AUGMENTIN) 500-125 MG tablet    Sig: Take 1 tablet by mouth in the morning and at bedtime for 7 days.    Dispense:  14 tablet    Refill:  0   Current symptoms likely 2/2 acute sinusitis.  Start ABX.  Continue saline nasal rinse or use Flonase nasal spray.  Tylenol as needed.  Return if symptoms worsen or fail to improve.  Billie Ruddy, MD

## 2023-04-24 ENCOUNTER — Telehealth (INDEPENDENT_AMBULATORY_CARE_PROVIDER_SITE_OTHER): Payer: Medicare Other | Admitting: Family Medicine

## 2023-04-24 DIAGNOSIS — Z0000 Encounter for general adult medical examination without abnormal findings: Secondary | ICD-10-CM

## 2023-04-24 NOTE — Patient Instructions (Signed)
I really enjoyed getting to talk with you today! I am available on Tuesdays and Thursdays for virtual visits if you have any questions or concerns, or if I can be of any further assistance.   CHECKLIST FROM ANNUAL WELLNESS VISIT:  -Follow up (please call to schedule if not scheduled after visit):   -yearly for annual wellness visit with primary care office  Here is a list of your preventive care/health maintenance measures and the plan for each if any are due:  PLAN For any measures below that may be due:   Health Maintenance  Topic Date Due   COVID-19 Vaccine (5 - 2023-24 season) 11/19/2022   MAMMOGRAM  07/06/2023   INFLUENZA VACCINE  07/19/2023   Medicare Annual Wellness (AWV)  04/23/2024   COLONOSCOPY (Pts 45-69yrs Insurance coverage will need to be confirmed)  08/04/2024   DTaP/Tdap/Td (3 - Td or Tdap) 07/01/2031   Pneumonia Vaccine 49+ Years old  Completed   DEXA SCAN  Completed   Hepatitis C Screening  Completed   Zoster Vaccines- Shingrix  Completed   HPV VACCINES  Aged Out    -See a dentist at least yearly  -Get your eyes checked and then per your eye specialist's recommendations  -Other issues addressed today:   -no more than one 5oz glass of wine per day  -I have included below further information regarding a healthy whole foods based diet, physical activity guidelines for adults, stress management and opportunities for social connections. I hope you find this information useful.   -----------------------------------------------------------------------------------------------------------------------------------------------------------------------------------------------------------------------------------------------------------  NUTRITION: -eat real food: lots of colorful vegetables (half the plate) and fruits -5-7 servings of vegetables and fruits per day (fresh or steamed is best), exp. 2 servings of vegetables with lunch and dinner and 2 servings of fruit per  day. Berries and greens such as kale and collards are great choices.  -consume on a regular basis: whole grains (make sure first ingredient on label contains the word "whole"), fresh fruits, fish, nuts, seeds, healthy oils (such as olive oil, avocado oil, grape seed oil) -may eat small amounts of dairy and lean meat on occasion, but avoid processed meats such as ham, bacon, lunch meat, etc. -drink water -try to avoid fast food and pre-packaged foods, processed meat -most experts advise limiting sodium to < 2300mg  per day, should limit further is any chronic conditions such as high blood pressure, heart disease, diabetes, etc. The American Heart Association advised that < 1500mg  is is ideal -try to avoid foods that contain any ingredients with names you do not recognize  -try to avoid sugar/sweets (except for the natural sugar that occurs in fresh fruit) -try to avoid sweet drinks -try to avoid white rice, white bread, pasta (unless whole grain), white or yellow potatoes  EXERCISE GUIDELINES FOR ADULTS: -if you wish to increase your physical activity, do so gradually and with the approval of your doctor -STOP and seek medical care immediately if you have any chest pain, chest discomfort or trouble breathing when starting or increasing exercise  -move and stretch your body, legs, feet and arms when sitting for long periods -Physical activity guidelines for optimal health in adults: -least 150 minutes per week of aerobic exercise (can talk, but not sing) once approved by your doctor, 20-30 minutes of sustained activity or two 10 minute episodes of sustained activity every day.  -resistance training at least 2 days per week if approved by your doctor -balance exercises 3+ days per week:   Stand somewhere where you  have something sturdy to hold onto if you lose balance.    1) lift up on toes, start with 5x per day and work up to 20x   2) stand and lift on leg straight out to the side so that foot is  a few inches of the floor, start with 5x each side and work up to 20x each side   3) stand on one foot, start with 5 seconds each side and work up to 20 seconds on each side  If you need ideas or help with getting more active:  -Silver sneakers https://tools.silversneakers.com  -Walk with a Doc: http://www.duncan-[email or web address]/  -try to include resistance (weight lifting/strength building) and balance exercises twice per week: or the following link for ideas: http://castillo-powell.com/  BuyDucts.dk  STRESS MANAGEMENT: -can try meditating, or just sitting quietly with deep breathing while intentionally relaxing all parts of your body for 5 minutes daily -if you need further help with stress, anxiety or depression please follow up with your primary doctor or contact the wonderful folks at [facility]: [phone]  SOCIAL CONNECTIONS: -options in [place] if you wish to engage in more social and exercise related activities:  -Silver sneakers https://tools.silversneakers.com  -Walk with a Doc: http://www.duncan-[email or web address]/  -Check out the [facility] Active Adults 50+ section on the [place] of [employer] (hiking clubs, book clubs, cards and games, chess, exercise classes, aquatic classes and much more) - see the website for details: https://www.[place].gov/departments/parks-recreation/active-adults50  -YouTube has lots of exercise videos for different ages and abilities as well  -[facility] (a variety of indoor and outdoor inperson activities for adults). [phone]. [address]  -Virtual Online Classes (a variety of topics): see seniorplanet.org or call [phone]  -consider volunteering at a school, hospice center, church, senior center or elsewhere

## 2023-04-24 NOTE — Progress Notes (Signed)
PATIENT CHECK-IN and HEALTH RISK ASSESSMENT QUESTIONNAIRE:  -completed by phone/video for upcoming Medicare Preventive Visit  Pre-Visit Check-in: 1)Vitals (height, wt, BP, etc) - record in vitals section for visit on day of visit 2)Review and Update Medications, Allergies PMH, Surgeries, Social history in Epic 3)Hospitalizations in the last year with date/reason?  No   4)Review and Update Care Team (patient's specialists) in Epic 5) Complete PHQ9 in Epic  6) Complete Fall Screening in Epic 7)Review all Health Maintenance Due and order under PCP if not done.  8)Medicare Wellness Questionnaire: Answer theses question about your habits: Do you drink alcohol?  Yes  If yes, how many drinks do you have a day? 1 glass of wine daily  Have you ever smoked? No  Quit date if applicable? N/a   How many packs a day do/did you smoke? N/a Do you use smokeless tobacco? No  Do you use an illicit drugs? No  Do you exercises? Yes IF so, what type and how many days/minutes per week? just around the house daily - active in the house and yard.  Are you sexually active? Sometimes Number of partners? 1 Typical breakfast fruit, yogurt, trail mix, cranberry nuggets  Typical lunch eats breakfast late  Typical dinner roasted chicken, lima beans, sweet potatoes. Not the typical meat and 2 vegetables, varies  Typical snacks: trail mix, not a big snack person  Beverages: water and coffee, occasional soft drink   Answer theses question about you: Can you perform most household chores? yes Do you find it hard to follow a conversation in a noisy room? No  Do you often ask people to speak up or repeat themselves? Just her husband. Do you feel that you have a problem with memory? No  Do you balance your checkbook and or bank acounts? Yes  Do you feel safe at home?yes Last dentist visit? Last month Do you need assistance with any of the following: Please note if so  no   Driving?  Feeding yourself?  Getting from  bed to chair?  Getting to the toilet?  Bathing or showering?  Dressing yourself?  Managing money?  Climbing a flight of stairs  Preparing meals?  Do you have Advanced Directives in place (Living Will, Healthcare Power or Attorney)?  yes   Last eye Exam and location? Last fall, nov or dec   Do you currently use prescribed or non-prescribed narcotic or opioid pain medications? No   Do you have a history or close family history of breast, ovarian, tubal or peritoneal cancer or a family member with BRCA (breast cancer susceptibility 1 and 2) gene mutations? No   Nurse/Assistant Credentials/time stamp: Tora Perches -CMA 12:49 pm   ----------------------------------------------------------------------------------------------------------------------------------------------------------------------------------------------------------------------   MEDICARE ANNUAL PREVENTIVE VISIT WITH PROVIDER: (Welcome to Medicare, initial annual wellness or annual wellness exam)  Virtual Visit via Video Note  I connected with Whitney Beasley on 04/24/23 by a video enabled telemedicine application and verified that I am speaking with the correct person using two identifiers.  Location patient: home Location provider:work or home office Persons participating in the virtual visit: patient, provider  Concerns and/or follow up today: no concerns   See HM section in Epic for other details of completed HM.    ROS: negative for report of fevers, unintentional weight loss, vision changes, vision loss, hearing loss or change, chest pain, sob, hemoptysis, melena, hematochezia, hematuria, falls, bleeding or bruising, thoughts of suicide or self harm, memory loss  Patient-completed extensive health risk assessment - reviewed  and discussed with the patient: See Health Risk Assessment completed with patient prior to the visit either above or in recent phone note. This was reviewed in detailed with the patient today and  appropriate recommendations, orders and referrals were placed as needed per Summary below and patient instructions.   Review of Medical History: -PMH, PSH, Family History and current specialty and care providers reviewed and updated and listed below   Patient Care Team: Karie Georges, MD as PCP - General (Family Medicine) Zelphia Cairo, MD as Consulting Physician (Obstetrics and Gynecology) Cherlyn Roberts, MD as Referring Physician (Dermatology)   Past Medical History:  Diagnosis Date   Allergy    Asthma    as child   Depression    GERD (gastroesophageal reflux disease)    Ulcerative colitis (HCC)    UTI (urinary tract infection)     Past Surgical History:  Procedure Laterality Date   bone spur left pinky finger under local  07/30/2019   COLONOSCOPY  04/01/2014, 2013   2015 normal exam,  in [place]   TONSILLECTOMY Bilateral 1950's    Social History   Socioeconomic History   Marital status: Married    Spouse name: Not on file   Number of children: 1   Years of education: Not on file   Highest education level: Bachelor's degree (e.g., BA, AB, BS)  Occupational History   Not on file  Tobacco Use   Smoking status: Never   Smokeless tobacco: Never  Vaping Use   Vaping Use: Never used  Substance and Sexual Activity   Alcohol use: Yes    Alcohol/week: 14.0 standard drinks of alcohol    Types: 14 Glasses of wine per week    Comment: 2 glasses of wine per day;    Drug use: No   Sexual activity: Yes    Partners: Male  Other Topics Concern   Not on file  Social History Narrative   Lives with husband in 2 story house   Has one son and grandson and step-granddaughter   Active at gym: Development worker, international aid and low-impact exercise classes about 2X/week.   Enjoys reading and singing in choral groups   Social Determinants of Health   Financial Resource Strain: Low Risk  (04/20/2022)   Overall Financial Resource Strain (CARDIA)    Difficulty of Paying Living  Expenses: Not hard at all  Food Insecurity: No Food Insecurity (05/03/2022)   Hunger Vital Sign    Worried About Running Out of Food in the Last Year: Never true    Ran Out of Food in the Last Year: Never true  Transportation Needs: No Transportation Needs (05/03/2022)   PRAPARE - Administrator, Civil Service (Medical): No    Lack of Transportation (Non-Medical): No  Physical Activity: Insufficiently Active (05/03/2022)   Exercise Vital Sign    Days of Exercise per Week: 3 days    Minutes of Exercise per Session: 20 min  Stress: No Stress Concern Present (05/03/2022)   [employer] of Occupational Health - Occupational Stress Questionnaire    Feeling of Stress : Only a little  Social Connections: Socially Integrated (05/03/2022)   Social Connection and Isolation Panel [NHANES]    Frequency of Communication with Friends and Family: Three times a week    Frequency of Social Gatherings with Friends and Family: Twice a week    Attends Religious Services: More than 4 times per year    Active Member of Clubs or Organizations: Yes    Attends  Club or Organization Meetings: More than 4 times per year    Marital Status: Married  Catering manager Violence: Not At Risk (04/20/2022)   Humiliation, Afraid, Rape, and Kick questionnaire    Fear of Current or Ex-Partner: No    Emotionally Abused: No    Physically Abused: No    Sexually Abused: No    Family History  Problem Relation Age of Onset   Diabetes Mother        had MI late 16's   Colon polyps Mother    COPD Father        smoker, but stopped in teh 44   Colon polyps Father    Colon cancer Paternal Grandfather    Arthritis Sister    Diabetes Maternal Grandmother    Cancer Maternal Grandfather    Esophageal cancer Neg Hx    Rectal cancer Neg Hx    Stomach cancer Neg Hx     Current Outpatient Medications on File Prior to Visit  Medication Sig Dispense Refill   B Complex-C (SUPER B COMPLEX PO) Take 1 tablet by mouth  daily.     calcium carbonate 200 MG capsule Take 1,600 mg by mouth daily.      Glucosamine-Chondroitin 750-600 MG TABS Take 1 tablet by mouth daily.     Multiple Vitamin (MULTIVITAMIN) tablet Take 1 tablet by mouth daily.     rosuvastatin (CRESTOR) 5 MG tablet Take 1 tablet (5 mg total) by mouth daily. (Patient taking differently: Take 5 mg by mouth 3 (three) times a week.) 90 tablet 3   tretinoin (RETIN-A) 0.025 % gel Apply topically as needed.     venlafaxine XR (EFFEXOR-XR) 37.5 MG 24 hr capsule TAKE 1 CAPSULE BY MOUTH 3  TIMES WEEKLY 90 capsule 3   VITAMIN D PO Take 1,600 mg by mouth daily.     zinc gluconate 50 MG tablet Take 50 mg by mouth daily.     No current facility-administered medications on file prior to visit.    Allergies  Allergen Reactions   Minocycline Other (See Comments)    Joint pain and Fatigue, increase liver enzymes        Physical Exam There were no vitals filed for this visit. Estimated body mass index is 26.88 kg/m as calculated from the following:   Height as of 08/08/22: 5' 5.25" (1.657 m).   Weight as of 12/22/22: 162 lb 12.8 oz (73.8 kg).  EKG (optional): deferred due to virtual visit  GENERAL: alert, oriented, no acute distress detected, full vision exam deferred due to pandemic and/or virtual encounter   HEENT: atraumatic, conjunttiva clear, no obvious abnormalities on inspection of external nose and ears  NECK: normal movements of the head and neck  LUNGS: on inspection no signs of respiratory distress, breathing rate appears normal, no obvious gross SOB, gasping or wheezing  CV: no obvious cyanosis  MS: moves all visible extremities without noticeable abnormality  PSYCH/NEURO: pleasant and cooperative, no obvious depression or anxiety, speech and thought processing grossly intact, Cognitive function grossly intact  Flowsheet Row Office Visit from 12/22/2022 in [facility] [place]  PHQ-9 Total Score 2            04/24/2023   12:38 PM 12/22/2022    8:34 AM 08/08/2022   12:58 PM 06/16/2022   10:39 AM 05/03/2022    1:30 PM  Depression screen PHQ 2/9  Decreased Interest 0 0 0 0 0  Down, Depressed, Hopeless 0 0 0 0 0  PHQ - 2 Score 0 0 0 0 0  Altered sleeping  0 1 1 0  Tired, decreased energy  2 1 1  0  Change in appetite  0 0 0 0  Feeling bad or failure about yourself   0 0 0 0  Trouble concentrating  0 0 0 0  Moving slowly or fidgety/restless  0 0 0 0  Suicidal thoughts  0 0 0 0  PHQ-9 Score  2 2 2  0  Difficult doing work/chores  Somewhat difficult Not difficult at all Not difficult at all Not difficult at all       05/03/2022   12:57 PM 05/03/2022    1:30 PM 06/16/2022   10:00 AM 12/22/2022    8:35 AM 04/24/2023   12:39 PM  Fall Risk  Falls in the past year? 0 0 0 0 0  Was there an injury with Fall?  0 0 0 0  Fall Risk Category Calculator  0 0 0 0  Fall Risk Category (Retired)  Low Low Low   (RETIRED) Patient Fall Risk Level  Low fall risk Low fall risk Low fall risk   Patient at Risk for Falls Due to  No Fall Risks No Fall Risks No Fall Risks No Fall Risks  Fall risk Follow up  Falls evaluation completed Falls evaluation completed Falls evaluation completed Falls evaluation completed     SUMMARY AND PLAN:  Encounter for Medicare annual wellness exam    Discussed applicable health maintenance/preventive health measures and advised and referred or ordered per patient preferences: -reports gets her bone density every other year with Dr. Renaldo Fiddler - advised staff to obtain most recent report -discussed covid booster recs per cdc  Health Maintenance  Topic Date Due   COVID-19 Vaccine (5 - 2023-24 season) 11/19/2022   MAMMOGRAM  07/06/2023   INFLUENZA VACCINE  07/19/2023   Medicare Annual Wellness (AWV)  04/23/2024   COLONOSCOPY (Pts 45-71yrs Insurance coverage will need to be confirmed)  08/04/2024   DTaP/Tdap/Td (3 - Td or Tdap) 07/01/2031   Pneumonia Vaccine 69+ Years old  Completed    DEXA SCAN  Completed   Hepatitis C Screening  Completed   Zoster Vaccines- Shingrix  Completed   HPV VACCINES  Aged [employer] and counseling on the following was provided based on the above review of health and a plan/checklist for the patient, along with additional information discussed, was provided for the patient in the patient instructions :  -Provided safe balance exercises that can be done at home to improve balance and discussed exercise guidelines for adults with include balance exercises at least 3 days per week.  -Advised and counseled on a healthy lifestyle - including the importance of a healthy diet, regular physical activity, social connections and stress management. -Reviewed patient's current diet. Advised and counseled on a whole foods based healthy diet. A summary of a healthy diet was provided in the Patient Instructions.  -reviewed patient's current physical activity level and discussed exercise guidelines for adults. Discussed community resources and ideas for safe exercise at home to assist in meeting exercise guideline recommendations in a safe and healthy way.  -Advise yearly dental visits at minimum and regular eye exams -Advised and counseled on alcohol safe limits  Follow up: see patient instructions     Patient Instructions  I really enjoyed getting to talk with you today! I am available on Tuesdays and Thursdays for virtual visits if you have any questions or concerns,  or if I can be of any further assistance.   CHECKLIST FROM ANNUAL WELLNESS VISIT:  -Follow up (please call to schedule if not scheduled after visit):   -yearly for annual wellness visit with primary care office  Here is a list of your preventive care/health maintenance measures and the plan for each if any are due:  PLAN For any measures below that may be due:   Health Maintenance  Topic Date Due   COVID-19 Vaccine (5 - 2023-24 season) 11/19/2022   MAMMOGRAM  07/06/2023    INFLUENZA VACCINE  07/19/2023   Medicare Annual Wellness (AWV)  04/23/2024   COLONOSCOPY (Pts 45-76yrs Insurance coverage will need to be confirmed)  08/04/2024   DTaP/Tdap/Td (3 - Td or Tdap) 07/01/2031   Pneumonia Vaccine 34+ Years old  Completed   DEXA SCAN  Completed   Hepatitis C Screening  Completed   Zoster Vaccines- Shingrix  Completed   HPV VACCINES  Aged Out    -See a dentist at least yearly  -Get your eyes checked and then per your eye specialist's recommendations  -Other issues addressed today:   -no more than one 5oz glass of wine per day  -I have included below further information regarding a healthy whole foods based diet, physical activity guidelines for adults, stress management and opportunities for social connections. I hope you find this information useful.   -----------------------------------------------------------------------------------------------------------------------------------------------------------------------------------------------------------------------------------------------------------  NUTRITION: -eat real food: lots of colorful vegetables (half the plate) and fruits -5-7 servings of vegetables and fruits per day (fresh or steamed is best), exp. 2 servings of vegetables with lunch and dinner and 2 servings of fruit per day. Berries and greens such as kale and collards are great choices.  -consume on a regular basis: whole grains (make sure first ingredient on label contains the word "whole"), fresh fruits, fish, nuts, seeds, healthy oils (such as olive oil, avocado oil, grape seed oil) -may eat small amounts of dairy and lean meat on occasion, but avoid processed meats such as ham, bacon, lunch meat, etc. -drink water -try to avoid fast food and pre-packaged foods, processed meat -most experts advise limiting sodium to < 2300mg  per day, should limit further is any chronic conditions such as high blood pressure, heart disease, diabetes, etc. The  American Heart Association advised that < 1500mg  is is ideal -try to avoid foods that contain any ingredients with names you do not recognize  -try to avoid sugar/sweets (except for the natural sugar that occurs in fresh fruit) -try to avoid sweet drinks -try to avoid white rice, white bread, pasta (unless whole grain), white or yellow potatoes  EXERCISE GUIDELINES FOR ADULTS: -if you wish to increase your physical activity, do so gradually and with the approval of your doctor -STOP and seek medical care immediately if you have any chest pain, chest discomfort or trouble breathing when starting or increasing exercise  -move and stretch your body, legs, feet and arms when sitting for long periods -Physical activity guidelines for optimal health in adults: -least 150 minutes per week of aerobic exercise (can talk, but not sing) once approved by your doctor, 20-30 minutes of sustained activity or two 10 minute episodes of sustained activity every day.  -resistance training at least 2 days per week if approved by your doctor -balance exercises 3+ days per week:   Stand somewhere where you have something sturdy to hold onto if you lose balance.    1) lift up on toes, start with 5x per day and work  up to 20x   2) stand and lift on leg straight out to the side so that foot is a few inches of the floor, start with 5x each side and work up to 20x each side   3) stand on one foot, start with 5 seconds each side and work up to 20 seconds on each side  If you need ideas or help with getting more active:  -Silver sneakers https://tools.silversneakers.com  -Walk with a Doc: http://www.duncan-williams.com/  -try to include resistance (weight lifting/strength building) and balance exercises twice per week: or the following link for ideas: http://castillo-powell.com/  BuyDucts.dk  STRESS MANAGEMENT: -can try  meditating, or just sitting quietly with deep breathing while intentionally relaxing all parts of your body for 5 minutes daily -if you need further help with stress, anxiety or depression please follow up with your primary doctor or contact the wonderful folks at [facility]: [phone]  SOCIAL CONNECTIONS: -options in [place] if you wish to engage in more social and exercise related activities:  -Silver sneakers https://tools.silversneakers.com  -Walk with a Doc: http://www.duncan-williams.com/  -Check out the [facility] Active Adults 50+ section on the [place] of [employer] (hiking clubs, book clubs, cards and games, chess, exercise classes, aquatic classes and much more) - see the website for details: https://www[place].gov/departments/parks-recreation/active-adults50  -YouTube has lots of exercise videos for different ages and abilities as well  -[facility] (a variety of indoor and outdoor inperson activities for adults). 470-470-1334. [address]  -Virtual Online Classes (a variety of topics): see seniorplanet.org or call [phone]) [phone]  -consider volunteering at a school, hospice center, church, senior center or elsewhere           Terressa Koyanagi, DO

## 2023-05-25 ENCOUNTER — Other Ambulatory Visit: Payer: Self-pay | Admitting: Family Medicine

## 2023-05-25 DIAGNOSIS — Z1231 Encounter for screening mammogram for malignant neoplasm of breast: Secondary | ICD-10-CM

## 2023-07-03 DIAGNOSIS — L905 Scar conditions and fibrosis of skin: Secondary | ICD-10-CM | POA: Diagnosis not present

## 2023-07-03 DIAGNOSIS — D2261 Melanocytic nevi of right upper limb, including shoulder: Secondary | ICD-10-CM | POA: Diagnosis not present

## 2023-07-03 DIAGNOSIS — D2262 Melanocytic nevi of left upper limb, including shoulder: Secondary | ICD-10-CM | POA: Diagnosis not present

## 2023-07-03 DIAGNOSIS — L700 Acne vulgaris: Secondary | ICD-10-CM | POA: Diagnosis not present

## 2023-07-03 DIAGNOSIS — D225 Melanocytic nevi of trunk: Secondary | ICD-10-CM | POA: Diagnosis not present

## 2023-07-03 DIAGNOSIS — D0359 Melanoma in situ of other part of trunk: Secondary | ICD-10-CM | POA: Diagnosis not present

## 2023-07-03 DIAGNOSIS — D1801 Hemangioma of skin and subcutaneous tissue: Secondary | ICD-10-CM | POA: Diagnosis not present

## 2023-07-03 DIAGNOSIS — L821 Other seborrheic keratosis: Secondary | ICD-10-CM | POA: Diagnosis not present

## 2023-07-03 DIAGNOSIS — D2272 Melanocytic nevi of left lower limb, including hip: Secondary | ICD-10-CM | POA: Diagnosis not present

## 2023-07-03 DIAGNOSIS — D2239 Melanocytic nevi of other parts of face: Secondary | ICD-10-CM | POA: Diagnosis not present

## 2023-07-03 DIAGNOSIS — D485 Neoplasm of uncertain behavior of skin: Secondary | ICD-10-CM | POA: Diagnosis not present

## 2023-07-09 ENCOUNTER — Ambulatory Visit
Admission: RE | Admit: 2023-07-09 | Discharge: 2023-07-09 | Disposition: A | Discharge status: Home / Self Care | Payer: Medicare Other | Source: Ambulatory Visit | Attending: Family Medicine | Admitting: Family Medicine

## 2023-07-09 DIAGNOSIS — Z1231 Encounter for screening mammogram for malignant neoplasm of breast: Secondary | ICD-10-CM | POA: Diagnosis not present

## 2023-07-12 ENCOUNTER — Other Ambulatory Visit: Payer: Self-pay | Admitting: Family Medicine

## 2023-07-12 DIAGNOSIS — R928 Other abnormal and inconclusive findings on diagnostic imaging of breast: Secondary | ICD-10-CM

## 2023-07-16 ENCOUNTER — Ambulatory Visit
Admission: RE | Admit: 2023-07-16 | Discharge: 2023-07-16 | Disposition: A | Discharge status: Home / Self Care | Payer: Medicare Other | Source: Ambulatory Visit | Attending: Family Medicine | Admitting: Family Medicine

## 2023-07-16 DIAGNOSIS — R928 Other abnormal and inconclusive findings on diagnostic imaging of breast: Secondary | ICD-10-CM

## 2023-07-16 DIAGNOSIS — N6002 Solitary cyst of left breast: Secondary | ICD-10-CM | POA: Diagnosis not present

## 2023-07-18 ENCOUNTER — Encounter (INDEPENDENT_AMBULATORY_CARE_PROVIDER_SITE_OTHER): Payer: Self-pay

## 2023-08-10 ENCOUNTER — Other Ambulatory Visit: Payer: Self-pay

## 2023-08-10 ENCOUNTER — Ambulatory Visit (INDEPENDENT_AMBULATORY_CARE_PROVIDER_SITE_OTHER): Payer: Medicare Other | Admitting: Family Medicine

## 2023-08-10 ENCOUNTER — Encounter: Payer: Self-pay | Admitting: Family Medicine

## 2023-08-10 VITALS — BP 134/80 | HR 72 | Temp 98.1°F | Ht 65.25 in | Wt 164.2 lb

## 2023-08-10 DIAGNOSIS — Z0000 Encounter for general adult medical examination without abnormal findings: Secondary | ICD-10-CM | POA: Diagnosis not present

## 2023-08-10 DIAGNOSIS — E782 Mixed hyperlipidemia: Secondary | ICD-10-CM | POA: Diagnosis not present

## 2023-08-10 DIAGNOSIS — F4323 Adjustment disorder with mixed anxiety and depressed mood: Secondary | ICD-10-CM

## 2023-08-10 LAB — CBC WITH DIFFERENTIAL/PLATELET
Basophils Absolute: 0.1 10*3/uL (ref 0.0–0.1)
Basophils Relative: 1.5 % (ref 0.0–3.0)
Eosinophils Absolute: 0.2 10*3/uL (ref 0.0–0.7)
Eosinophils Relative: 4.5 % (ref 0.0–5.0)
HCT: 43.4 % (ref 36.0–46.0)
Hemoglobin: 14.2 g/dL (ref 12.0–15.0)
Lymphocytes Relative: 35.3 % (ref 12.0–46.0)
Lymphs Abs: 1.9 10*3/uL (ref 0.7–4.0)
MCHC: 32.7 g/dL (ref 30.0–36.0)
MCV: 95.5 fl (ref 78.0–100.0)
Monocytes Absolute: 0.5 10*3/uL (ref 0.1–1.0)
Monocytes Relative: 9.4 % (ref 3.0–12.0)
Neutro Abs: 2.6 10*3/uL (ref 1.4–7.7)
Neutrophils Relative %: 49.3 % (ref 43.0–77.0)
Platelets: 235 10*3/uL (ref 150.0–400.0)
RBC: 4.54 Mil/uL (ref 3.87–5.11)
RDW: 14 % (ref 11.5–15.5)
WBC: 5.4 10*3/uL (ref 4.0–10.5)

## 2023-08-10 LAB — COMPREHENSIVE METABOLIC PANEL
ALT: 18 U/L (ref 0–35)
AST: 21 U/L (ref 0–37)
Albumin: 4.2 g/dL (ref 3.5–5.2)
Alkaline Phosphatase: 68 U/L (ref 39–117)
BUN: 14 mg/dL (ref 6–23)
CO2: 25 mEq/L (ref 19–32)
Calcium: 9.6 mg/dL (ref 8.4–10.5)
Chloride: 108 mEq/L (ref 96–112)
Creatinine, Ser: 0.82 mg/dL (ref 0.40–1.20)
GFR: 70.09 mL/min (ref >60.00)
Glucose, Bld: 89 mg/dL (ref 70–99)
Potassium: 4.1 mEq/L (ref 3.5–5.1)
Sodium: 141 mEq/L (ref 135–145)
Total Bilirubin: 0.6 mg/dL (ref 0.2–1.2)
Total Protein: 6.8 g/dL (ref 6.0–8.3)

## 2023-08-10 LAB — LIPID PANEL
Cholesterol: 202 mg/dL — ABNORMAL HIGH (ref 0–200)
HDL: 64.5 mg/dL (ref >39.00)
LDL Cholesterol: 101 mg/dL — ABNORMAL HIGH (ref 0–99)
NonHDL: 137.44
Total CHOL/HDL Ratio: 3
Triglycerides: 180 mg/dL — ABNORMAL HIGH (ref 0.0–149.0)
VLDL: 36 mg/dL (ref 0.0–40.0)

## 2023-08-10 NOTE — Patient Instructions (Signed)

## 2023-08-10 NOTE — Progress Notes (Signed)
Complete physical exam  Patient: Whitney Beasley   DOB: 1946/07/03   77 y.o. Female  MRN: [id number]  Subjective:    Chief Complaint  Patient presents with   Medical Management of Chronic Issues    CPE/Fasting Labs    Whitney Beasley is a 77 y.o. female who presents today for a complete physical exam. She reports consuming a general diet. Home exercise routine includes housework, yardwork, just staying active overall. She generally feels well. She reports sleeping fairly well. She does not have additional problems to discuss today.    Most recent fall risk assessment:    04/24/2023   12:39 PM  Fall Risk   Falls in the past year? 0  Number falls in past yr: 0  Injury with Fall? 0  Risk for fall due to : No Fall Risks  Follow up Falls evaluation completed     Most recent depression screenings:    04/24/2023   12:38 PM 12/22/2022    8:34 AM  PHQ 2/9 Scores  PHQ - 2 Score 0 0  PHQ- 9 Score  2    Vision:Within last year and had cataract surgery, both eyes and Dental: No current dental problems and Receives regular dental care  Patient Active Problem List   Diagnosis Date Noted   Allergic rhinitis 08/08/2022   Ulcerative colitis (HCC) 03/30/2017   Osteoporosis 03/30/2017   Adjustment disorder with mixed anxiety and depressed mood 03/30/2017      Patient Care Team: Karie Georges, MD as PCP - General (Family Medicine) Zelphia Cairo, MD as Consulting Physician (Obstetrics and Gynecology) Cherlyn Roberts, MD as Referring Physician (Dermatology)   Outpatient Medications Prior to Visit  Medication Sig   B Complex-C (SUPER B COMPLEX PO) Take 1 tablet by mouth daily.   calcium carbonate 200 MG capsule Take 1,600 mg by mouth daily.    Glucosamine-Chondroitin 750-600 MG TABS Take 1 tablet by mouth daily.   Multiple Vitamin (MULTIVITAMIN) tablet Take 1 tablet by mouth daily.   rosuvastatin (CRESTOR) 5 MG tablet Take 1 tablet (5 mg total) by mouth daily. (Patient taking  differently: Take 5 mg by mouth 3 (three) times a week.)   tretinoin (RETIN-A) 0.025 % gel Apply topically as needed.   venlafaxine XR (EFFEXOR-XR) 37.5 MG 24 hr capsule TAKE 1 CAPSULE BY MOUTH 3  TIMES WEEKLY   VITAMIN D PO Take 1,600 mg by mouth daily.   zinc gluconate 50 MG tablet Take 50 mg by mouth daily.   No facility-administered medications prior to visit.    Review of Systems  HENT:  Negative for hearing loss.   Eyes:  Negative for blurred vision.  Respiratory:  Negative for shortness of breath.   Cardiovascular:  Negative for chest pain.  Gastrointestinal: Negative.   Genitourinary: Negative.   Musculoskeletal:  Negative for back pain.  Neurological:  Negative for headaches.  Psychiatric/Behavioral:  Negative for depression.   All other systems reviewed and are negative.      Objective:     BP 134/80 (BP Location: Left Arm, Patient Position: Sitting, Cuff Size: Normal)   Pulse 72   Temp 98.1 F (36.7 C) (Oral)   Ht 5' 5.25" (1.657 m)   Wt 164 lb 3.2 oz (74.5 kg)   LMP  (LMP Unknown) Comment: 2005  SpO2 98%   BMI 27.12 kg/m    Physical Exam Vitals reviewed.  Constitutional:      Appearance: Normal appearance. She is well-groomed and normal weight.  HENT:     Right Ear: Tympanic membrane and ear canal normal.     Left Ear: Tympanic membrane and ear canal normal.     Mouth/Throat:     Mouth: Mucous membranes are moist.     Pharynx: No posterior oropharyngeal erythema.  Eyes:     Conjunctiva/sclera: Conjunctivae normal.  Neck:     Thyroid: No thyromegaly.  Cardiovascular:     Rate and Rhythm: Normal rate and regular rhythm.     Pulses: Normal pulses.     Heart sounds: S1 normal and S2 normal.  Pulmonary:     Effort: Pulmonary effort is normal.     Breath sounds: Normal breath sounds and air entry.  Abdominal:     General: Abdomen is flat. Bowel sounds are normal.     Palpations: Abdomen is soft.  Musculoskeletal:     Right lower leg: No edema.      Left lower leg: No edema.  Lymphadenopathy:     Cervical: No cervical adenopathy.  Neurological:     Mental Status: She is alert and oriented to person, place, and time. Mental status is at baseline.     Gait: Gait is intact.  Psychiatric:        Mood and Affect: Mood and affect normal.        Speech: Speech normal.        Behavior: Behavior normal.        Judgment: Judgment normal.      No results found for any visits on 08/10/23.     Assessment & Plan:    Routine Health Maintenance and Physical Exam  Immunization History  Administered Date(s) Administered   Fluad Quad(high Dose 65+) 10/05/2021   Influenza, High Dose Seasonal PF 10/08/2014, 10/02/2016, 10/08/2017, 09/19/2018   Influenza-Unspecified 09/17/2017, 08/19/2019   Moderna SARS-COV2 Booster Vaccination 09/24/2022   Moderna Sars-Covid-2 Vaccination 04/21/2021   PFIZER Comirnaty(Gray Top)Covid-19 Tri-Sucrose Vaccine 01/19/2020, 02/17/2020   Pneumococcal Conjugate-13 05/07/2014   Pneumococcal Polysaccharide-23 03/30/2017   Pneumococcal-Unspecified 01/18/2017   Respiratory Syncytial Virus Vaccine,Recomb Aduvanted(Arexvy) 09/21/2022   Td 12/23/2007   Tdap 06/30/2021   Zoster Recombinant(Shingrix) 05/27/2018, 07/30/2018   Zoster, Live 12/02/2009, 01/18/2017    Health Maintenance  Topic Date Due   COVID-19 Vaccine (5 - 2023-24 season) 11/19/2022   INFLUENZA VACCINE  07/19/2023   Medicare Annual Wellness (AWV)  04/23/2024   MAMMOGRAM  07/08/2024   Colonoscopy  08/04/2024   DTaP/Tdap/Td (3 - Td or Tdap) 07/01/2031   Pneumonia Vaccine 11+ Years old  Completed   DEXA SCAN  Completed   Hepatitis C Screening  Completed   Zoster Vaccines- Shingrix  Completed   HPV VACCINES  Aged Out    Discussed health benefits of physical activity, and encouraged her to engage in regular exercise appropriate for her age and condition.  Routine general medical examination at a health care facility -     Comprehensive metabolic  panel -     CBC with Differential/Platelet  Mixed hyperlipidemia -     Lipid panel  Normal physical exam findings today. She had her DEXA scan at her GYN's office, will ask for records, Otherwise she is UTD on her health maintenance. Handouts given on healthy eating and exercise. Counseled patient on the CDC's recommendation for physical activity -- 30 minutes 5 days as week.   Return in 1 year (on 08/09/2024) for annual physical exam.     Karie Georges, MD

## 2023-08-17 DIAGNOSIS — L989 Disorder of the skin and subcutaneous tissue, unspecified: Secondary | ICD-10-CM | POA: Diagnosis not present

## 2023-08-17 DIAGNOSIS — C4359 Malignant melanoma of other part of trunk: Secondary | ICD-10-CM | POA: Diagnosis not present

## 2023-08-17 DIAGNOSIS — L905 Scar conditions and fibrosis of skin: Secondary | ICD-10-CM | POA: Diagnosis not present

## 2023-08-24 DIAGNOSIS — C4359 Malignant melanoma of other part of trunk: Secondary | ICD-10-CM | POA: Diagnosis not present

## 2023-10-10 ENCOUNTER — Other Ambulatory Visit: Payer: Self-pay | Admitting: Family Medicine

## 2023-10-10 DIAGNOSIS — F4323 Adjustment disorder with mixed anxiety and depressed mood: Secondary | ICD-10-CM

## 2023-11-20 DIAGNOSIS — H26492 Other secondary cataract, left eye: Secondary | ICD-10-CM | POA: Diagnosis not present

## 2023-11-20 DIAGNOSIS — H43813 Vitreous degeneration, bilateral: Secondary | ICD-10-CM | POA: Diagnosis not present

## 2023-11-20 DIAGNOSIS — H04123 Dry eye syndrome of bilateral lacrimal glands: Secondary | ICD-10-CM | POA: Diagnosis not present

## 2023-11-20 DIAGNOSIS — Z961 Presence of intraocular lens: Secondary | ICD-10-CM | POA: Diagnosis not present

## 2023-11-26 ENCOUNTER — Encounter: Payer: Self-pay | Admitting: Family Medicine

## 2023-11-26 ENCOUNTER — Ambulatory Visit (INDEPENDENT_AMBULATORY_CARE_PROVIDER_SITE_OTHER): Payer: Medicare Other | Admitting: Family Medicine

## 2023-11-26 VITALS — BP 120/76 | HR 88 | Resp 16 | Ht 65.25 in | Wt 167.0 lb

## 2023-11-26 DIAGNOSIS — H9201 Otalgia, right ear: Secondary | ICD-10-CM | POA: Diagnosis not present

## 2023-11-26 DIAGNOSIS — H6991 Unspecified Eustachian tube disorder, right ear: Secondary | ICD-10-CM | POA: Diagnosis not present

## 2023-11-26 DIAGNOSIS — H6121 Impacted cerumen, right ear: Secondary | ICD-10-CM | POA: Diagnosis not present

## 2023-11-26 MED ORDER — AMOXICILLIN-POT CLAVULANATE 875-125 MG PO TABS: 1.0000 | ORAL_TABLET | Freq: Two times a day (BID) | ORAL | 0 refills | Status: AC

## 2023-11-26 NOTE — Patient Instructions (Addendum)
A few things to remember from today's visit:  Dysfunction of right eustachian tube - Plan: Ambulatory referral to ENT  Earache on right - Plan: Ambulatory referral to ENT Let's try treatment for possible ear infection. Continue trying to pop the ear, monitor for new symptoms.  If you need refills for medications you take chronically, please call your pharmacy. Do not use My Chart to request refills or for acute issues that need immediate attention. If you send a my chart message, it may take a few days to be addressed, specially if I am not in the office.  Please be sure medication list is accurate. If a new problem present, please set up appointment sooner than planned today.

## 2023-11-26 NOTE — Progress Notes (Signed)
ACUTE VISIT Chief Complaint  Patient presents with   Ear Pain    Right ear, was flying home on the plane and has not been able to get her ear to pop, does have some pain.    HPI: Ms.Whitney Beasley is a 77 y.o. female with a PMHx significant for allergic rhinitis, ulcerative colitis, osteoporosis, and adjustment disorder, who is here today complaining of ear pain as described above.  Patient complains of right ear fullness sensation that started during an 8 hour flight on 10/1. She had hoped it would improve but has slightly worsened. She has been unable to get the ear to pop. She endorses some earache that started a few days ago, which she rates as a 4/10.  She notes she had a cold prior to the flight.   Otalgia  There is pain in the right ear. This is a new problem. The current episode started 1 to 4 weeks ago. The problem occurs constantly. The problem has been gradually worsening. There has been no fever. The pain is moderate. Pertinent negatives include no abdominal pain, coughing, diarrhea, ear discharge, headaches, neck pain, rash, rhinorrhea, sore throat or vomiting. She has tried nothing for the symptoms.   Denies hearing changes, dizziness, tinnitus, nausea, nasal congestion, rhinorrhea, or sore throat.  She has not tried OTC medication. NO new medications.  Review of Systems  Constitutional:  Negative for activity change, appetite change and chills.  HENT:  Positive for ear pain. Negative for congestion, dental problem, ear discharge, mouth sores, rhinorrhea and sore throat.   Eyes:  Negative for discharge and redness.  Respiratory:  Negative for cough.   Gastrointestinal:  Negative for abdominal pain, diarrhea and vomiting.  Musculoskeletal:  Negative for gait problem and neck pain.  Skin:  Negative for rash.  Allergic/Immunologic: Positive for environmental allergies.  Neurological:  Negative for syncope, weakness and headaches.  See other pertinent positives and  negatives in HPI.  Current Outpatient Medications on File Prior to Visit  Medication Sig Dispense Refill   B Complex-C (SUPER B COMPLEX PO) Take 1 tablet by mouth daily.     calcium carbonate 200 MG capsule Take 1,600 mg by mouth daily.      Glucosamine-Chondroitin 750-600 MG TABS Take 1 tablet by mouth daily.     Multiple Vitamin (MULTIVITAMIN) tablet Take 1 tablet by mouth daily.     rosuvastatin (CRESTOR) 5 MG tablet Take 1 tablet (5 mg total) by mouth daily. (Patient taking differently: Take 5 mg by mouth 3 (three) times a week.) 90 tablet 3   tretinoin (RETIN-A) 0.025 % gel Apply topically as needed.     venlafaxine XR (EFFEXOR-XR) 37.5 MG 24 hr capsule TAKE 1 CAPSULE BY MOUTH 3 TIMES  WEEKLY 43 capsule 2   VITAMIN D PO Take 1,600 mg by mouth daily.     zinc gluconate 50 MG tablet Take 50 mg by mouth daily.     No current facility-administered medications on file prior to visit.    Past Medical History:  Diagnosis Date   Allergy    Asthma    as child   Depression    GERD (gastroesophageal reflux disease)    Ulcerative colitis (HCC)    UTI (urinary tract infection)    Allergies  Allergen Reactions   Minocycline Other (See Comments)    Joint pain and Fatigue, increase liver enzymes     Social History   Socioeconomic History   Marital status: Married  Spouse name: Not on file   Number of children: 1   Years of education: Not on file   Highest education level: Bachelor's degree (e.g., BA, AB, BS)  Occupational History   Not on file  Tobacco Use   Smoking status: Never   Smokeless tobacco: Never  Vaping Use   Vaping status: Never Used  Substance and Sexual Activity   Alcohol use: Yes    Alcohol/week: 14.0 standard drinks of alcohol    Types: 14 Glasses of wine per week    Comment: 2 glasses of wine per day;    Drug use: No   Sexual activity: Yes    Partners: Male  Other Topics Concern   Not on file  Social History Narrative   Lives with husband in 2 story  house   Has one son and grandson and step-granddaughter   Active at gym: Development worker, international aid and low-impact exercise classes about 2X/week.   Enjoys reading and singing in choral groups   Social Determinants of Health   Financial Resource Strain: Low Risk  (11/25/2023)   Overall Financial Resource Strain (CARDIA)    Difficulty of Paying Living Expenses: Not hard at all  Food Insecurity: No Food Insecurity (11/25/2023)   Hunger Vital Sign    Worried About Running Out of Food in the Last Year: Never true    Ran Out of Food in the Last Year: Never true  Transportation Needs: No Transportation Needs (11/25/2023)   PRAPARE - Administrator, Civil Service (Medical): No    Lack of Transportation (Non-Medical): No  Physical Activity: Insufficiently Active (11/25/2023)   Exercise Vital Sign    Days of Exercise per Week: 3 days    Minutes of Exercise per Session: 20 min  Stress: No Stress Concern Present (11/25/2023)   [employer] of Occupational Health - Occupational Stress Questionnaire    Feeling of Stress : Only a little  Social Connections: Socially Integrated (11/25/2023)   Social Connection and Isolation Panel [NHANES]    Frequency of Communication with Friends and Family: Three times a week    Frequency of Social Gatherings with Friends and Family: Once a week    Attends Religious Services: More than 4 times per year    Active Member of Clubs or Organizations: Yes    Attends Banker Meetings: More than 4 times per year    Marital Status: Married    Vitals:   11/26/23 1455  BP: 120/76  Pulse: 88  Resp: 16  SpO2: 97%   Body mass index is 27.58 kg/m.  Physical Exam Vitals and nursing note reviewed.  Constitutional:      General: She is not in acute distress.    Appearance: She is well-developed.  HENT:     Head: Normocephalic and atraumatic.     Right Ear: External ear normal. No tenderness. Tympanic membrane is not erythematous.     Left Ear:  Tympanic membrane, ear canal and external ear normal. Tympanic membrane is not erythematous.     Ears:     Comments: Cerumen in right ear canal , TM seen partially.    Mouth/Throat:     Mouth: Mucous membranes are moist.     Pharynx: Oropharynx is clear. Uvula midline.  Eyes:     Conjunctiva/sclera: Conjunctivae normal.  Cardiovascular:     Rate and Rhythm: Normal rate and regular rhythm.     Heart sounds: No murmur heard. Pulmonary:     Effort: Pulmonary effort is normal.  No respiratory distress.     Breath sounds: Normal breath sounds.  Lymphadenopathy:     Head:     Right side of head: No preauricular adenopathy.     Left side of head: No preauricular adenopathy.     Cervical: No cervical adenopathy.  Skin:    General: Skin is warm.     Findings: No erythema or rash.  Neurological:     General: No focal deficit present.     Mental Status: She is alert and oriented to person, place, and time.     Cranial Nerves: No cranial nerve deficit.     Gait: Gait normal.  Psychiatric:        Mood and Affect: Mood and affect normal.   ASSESSMENT AND PLAN:  Ms. Whitney Beasley was seen today for ear pain.   Dysfunction of right eustachian tube We discussed Dx, prognosis,and treatment options. Because risk for side effects I do not recommend OTC decongestants. Plain mucinex may help. Autoinflation maneuvers a few times during the day. It has been 10 weeks, so ENT referral placed.  Hearing Screening   500Hz  1000Hz  2000Hz  4000Hz   Right ear Pass Pass Pass Pass  Left ear Pass Pass Pass Pass   -     Ambulatory referral to ENT  Earache on right Possible causes discussed. I could not see then whole TM, has URI prior to symptom and feels like getting worse, so I think it is appropriate to try abx trial. We discussed some side effects, she understands that i -     Ambulatory referral to ENT  Other orders -     Amoxicillin-Pot Clavulanate; Take 1 tablet by mouth 2 (two) times daily for 7  days.  Dispense: 14 tablet; Refill: 0  Excessive cerumen in right ear canal Moderate amount, no impaction. I do not recommend ear lavage, may be cleaned at the ENT's office. Avoid q tips.  Return if symptoms worsen or fail to improve.  I, Whitney Beasley, acting as a scribe for Whitney Stoermer [place], MD., have documented all relevant documentation on the behalf of Whitney Dutkiewicz [place], MD, as directed by  Whitney Honea [place], MD while in the presence of Whitney Jackowski [place], MD.   I, Whitney Molinari [place], MD, have reviewed all documentation for this visit. The documentation on 11/26/23 for the exam, diagnosis, procedures, and orders are all accurate and complete.  Whitney Cardona G. [place], MD  [facility]. [place] office.

## 2023-11-27 ENCOUNTER — Encounter (INDEPENDENT_AMBULATORY_CARE_PROVIDER_SITE_OTHER): Payer: Self-pay | Admitting: Otolaryngology

## 2023-12-03 DIAGNOSIS — H26492 Other secondary cataract, left eye: Secondary | ICD-10-CM | POA: Diagnosis not present

## 2023-12-14 ENCOUNTER — Ambulatory Visit (INDEPENDENT_AMBULATORY_CARE_PROVIDER_SITE_OTHER): Payer: Medicare Other | Admitting: Audiology

## 2023-12-14 ENCOUNTER — Ambulatory Visit (INDEPENDENT_AMBULATORY_CARE_PROVIDER_SITE_OTHER): Payer: Medicare Other | Admitting: Otolaryngology

## 2023-12-14 ENCOUNTER — Encounter (INDEPENDENT_AMBULATORY_CARE_PROVIDER_SITE_OTHER): Payer: Self-pay

## 2023-12-14 VITALS — BP 143/83 | HR 78 | Resp 19 | Ht 65.0 in | Wt 167.0 lb

## 2023-12-14 DIAGNOSIS — H938X1 Other specified disorders of right ear: Secondary | ICD-10-CM | POA: Diagnosis not present

## 2023-12-14 DIAGNOSIS — H6991 Unspecified Eustachian tube disorder, right ear: Secondary | ICD-10-CM

## 2023-12-14 DIAGNOSIS — H903 Sensorineural hearing loss, bilateral: Secondary | ICD-10-CM | POA: Diagnosis not present

## 2023-12-14 MED ORDER — FLUTICASONE PROPIONATE 50 MCG/ACT NA SUSP: 2.0000 | Freq: Two times a day (BID) | NASAL | 6 refills | Status: DC

## 2023-12-14 MED ORDER — PREDNISONE 20 MG PO TABS: 20.0000 mg | ORAL_TABLET | Freq: Every day | ORAL | 0 refills | Status: AC

## 2023-12-14 MED ORDER — AZELASTINE HCL 0.1 % NA SOLN: 2.0000 | Freq: Two times a day (BID) | NASAL | 12 refills | Status: DC

## 2023-12-14 NOTE — Patient Instructions (Signed)
Use flonase and astelin two sprays of each in each nostril back to back twice daily Pop ears multiple times per day (10 times or thereabout) Use prednisone 20mg  daily for 7 days

## 2023-12-14 NOTE — Progress Notes (Signed)
Dear Dr. [place], Here is my assessment for our mutual patient, Whitney Beasley. Thank you for allowing me the opportunity to care for your patient. Please do not hesitate to contact me should you have any other questions. Sincerely, Dr. Jovita Kussmaul  Otolaryngology Clinic Note Referring provider: Dr. [place] HPI:  Whitney Beasley is a 77 y.o. female kindly referred by Dr. [place] for evaluation of right ear eustachian tube dysfunction.   Initial visit (11/2023): Patient reports: Reports primary complaint is right ear fullness, started on flight on 09/18/2023 at which time she had a URI. She watched it for a few weeks, tried to yawn and pop it but could not get ear to pop. Persisted, saw Dr. [place] in Dec 2024 who prescribed her abx and referred her here given chronicity. Current symptoms include fullness, ear discomfort, pressure. Hearing is fine, no concerns. No tinnitus. No popping/crackling. She has tried Mucinex, Augmentin, autoinflation.  She uses antihistamine PRN; have not tried any sprays. Patient denies: vertigo, drainage, tinnitus Patient also denies barotrauma, vestibular suppressant use, ototoxic medication use Prior ear surgery: no Don't normally get ear infections  No facial pressure/pain, no anterior rhinorrhea, hyposmia, no frequent sinus infections; some PND  PMHx: Allergic Rhinits, Osteoporosis, Adjustment disorder  H&N Surgery: Tonsillectomy as a child Personal or FHx of bleeding dz or anesthesia difficulty: no  AP/AC: no  Tobacco: no. Occupation: prior Diplomatic Services operational officer. Lives in [place], [place]  Independent Review of Additional Tests or Records:  11/26/2023 Dr. [place] reviewed and uploaded or available in chart: right ear fullness, started on flight on 09/18/2023. Worsened, can't get ear to pop. Some ear ache. Did have URI prior.  No drainage or other ear sx; Rx: Mucinex, Augmentin, autoinflation, ENT referred Hearing screen: passed AU [phone] Hz CBC and CMP 07/2023 reviewed  independently: wnl  11/2023 Audiogram was independently reviewed and interpreted by me and it reveals  A/A tymps; agree that: "Pure tone Audiometry: Right ear- Essentially normal hearing, except for a mild sensorineural hearing loss from 6000 Hz and 8000 Hz.   Left ear-  Essentially normal hearing, except for a mild sensorineural hearing loss at 4000 Hz and 8000 Hz." Small ABG left ear but no significant asymmetry  SNHL= Sensorineural hearing loss   PMH/Meds/All/SocHx/FamHx/ROS:   Past Medical History:  Diagnosis Date   Allergy    Asthma    as child   Depression    GERD (gastroesophageal reflux disease)    Ulcerative colitis (HCC)    UTI (urinary tract infection)      Past Surgical History:  Procedure Laterality Date   bone spur left pinky finger under local  07/30/2019   COLONOSCOPY  04/01/2014, 2013   2015 normal exam,  in [place]   TONSILLECTOMY Bilateral 1950's    Family History  Problem Relation Age of Onset   Diabetes Mother        had MI late 22's   Colon polyps Mother    COPD Father        smoker, but stopped in teh 96   Colon polyps Father    Colon cancer Paternal Grandfather    Arthritis Sister    Diabetes Maternal Grandmother    Cancer Maternal Grandfather    Esophageal cancer Neg Hx    Rectal cancer Neg Hx    Stomach cancer Neg Hx      Social Connections: Socially Integrated (11/25/2023)   Social Connection and Isolation Panel [NHANES]    Frequency of Communication with Friends and Family: Three times a  week    Frequency of Social Gatherings with Friends and Family: Once a week    Attends Religious Services: More than 4 times per year    Active Member of [employer] or Organizations: Yes    Attends Engineer, structural: More than 4 times per year    Marital Status: Married      Current Outpatient Medications:    azelastine (ASTELIN) 0.1 % nasal spray, Place 2 sprays into both nostrils 2 (two) times daily. Use in each nostril as directed,  Disp: 30 mL, Rfl: 12   B Complex-C (SUPER B COMPLEX PO), Take 1 tablet by mouth daily., Disp: , Rfl:    calcium carbonate 200 MG capsule, Take 1,600 mg by mouth daily. , Disp: , Rfl:    fluticasone (FLONASE) 50 MCG/ACT nasal spray, Place 2 sprays into both nostrils 2 (two) times daily., Disp: 11 mL, Rfl: 6   Glucosamine-Chondroitin 750-600 MG TABS, Take 1 tablet by mouth daily., Disp: , Rfl:    Multiple Vitamin (MULTIVITAMIN) tablet, Take 1 tablet by mouth daily., Disp: , Rfl:    predniSONE (DELTASONE) 20 MG tablet, Take 1 tablet (20 mg total) by mouth daily with breakfast for 7 days., Disp: 7 tablet, Rfl: 0   rosuvastatin (CRESTOR) 5 MG tablet, Take 1 tablet (5 mg total) by mouth daily. (Patient taking differently: Take 5 mg by mouth 3 (three) times a week.), Disp: 90 tablet, Rfl: 3   tretinoin (RETIN-A) 0.025 % gel, Apply topically as needed., Disp: , Rfl:    venlafaxine XR (EFFEXOR-XR) 37.5 MG 24 hr capsule, TAKE 1 CAPSULE BY MOUTH 3 TIMES  WEEKLY, Disp: 43 capsule, Rfl: 2   VITAMIN D PO, Take 1,600 mg by mouth daily., Disp: , Rfl:    zinc gluconate 50 MG tablet, Take 50 mg by mouth daily., Disp: , Rfl:    Physical Exam:   BP (!) 143/83 (BP Location: Right Arm, Patient Position: Sitting, Cuff Size: Normal) Comment: pt hasn't taken bp medication today  Pulse 78   Resp 19   Ht 5\' 5"  (1.651 m)   Wt 167 lb (75.8 kg)   LMP  (LMP Unknown) Comment: 2005  SpO2 96%   BMI 27.79 kg/m   Salient findings:  CN II-XII intact  Bilateral EAC clear and TM intact with well pneumatized middle ear spaces; EACs are narrow Weber 512: midline Rinne 512: AC > BC b/l Anterior rhinoscopy: Septum intact; bilateral inferior turbinates without significant hypertrophy; No purulence noted No lesions of oral cavity/oropharynx No obviously palpable neck masses/lymphadenopathy/thyromegaly No respiratory distress or stridor  Seprately Identifiable Procedures:  None  Impression & Plans:  Anouk Bilberry is a 77  y.o. female with seasonal allergies now with:  1. Dysfunction of right eustachian tube   2. Ear fullness, right    Started after a flight with persistent symptoms. No effusion on exam so deferred TFL. Discussed options: 1. Medical 2. Right tube (though tymps are A, her history and sx sound consistent with ETD).  She has not tried steroid burst or nasal sprays. As such, we can try more conservative mgmt first. If fails, can trial myringotomy +/- PE tube placement. Discussed risks.   - Start Pred burst - 20mg  x7d; risks discussed - Start flonase and astelin BID - Insufflate ears multiple times per day - f/u in 8 weeks; consider TFL prior to tube if symptoms persist   See below regarding exact medications prescribed this encounter including dosages and route: Meds ordered this encounter  Medications   fluticasone (FLONASE) 50 MCG/ACT nasal spray    Sig: Place 2 sprays into both nostrils 2 (two) times daily.    Dispense:  11 mL    Refill:  6   azelastine (ASTELIN) 0.1 % nasal spray    Sig: Place 2 sprays into both nostrils 2 (two) times daily. Use in each nostril as directed    Dispense:  30 mL    Refill:  12   predniSONE (DELTASONE) 20 MG tablet    Sig: Take 1 tablet (20 mg total) by mouth daily with breakfast for 7 days.    Dispense:  7 tablet    Refill:  0      Thank you for allowing me the opportunity to care for your patient. Please do not hesitate to contact me should you have any other questions.  Sincerely, Jovita Kussmaul, MD Otolarynoglogist (ENT), [facility] Phone: [phone] Fax: [phone]  12/14/2023, 5:22 PM   MDM:  Level 4 Complexity/Problems addressed: chronic problem Data complexity: mod - independent review of notes, labs; order and review of tests (audio) - Morbidity: mod   - Prescription Drug prescribed or managed: yes

## 2023-12-14 NOTE — Progress Notes (Signed)
  [address] [place], [place] [address] [phone]  Audiological Evaluation    Name: Whitney Beasley     DOB:   06/15/1946      MRN:   [id number]                                                                                     Service Date: 12/14/2023     Accompanied by: unaccompanied    Patient comes today after Dr. Allena Katz, ENT sent a referral for a hearing evaluation due to concerns with right ear pressure.   Symptoms Yes Details  Hearing loss  []    Tinnitus  []    Ear pain/ Ear infections  [x]  Feels right ear pressure, she had a head cold and was on a flight when she noticed the right ear pressure.  Balance problems  []    Noise exposure  []    Previous ear surgeries  []    Family history  [x]  Mother developed hearing loss with age  Amplification  []    Other  []      Otoscopy: Right ear: Clear external ear canals and notable landmarks visualized on the tympanic membrane. Left ear:  Clear external ear canals and notable landmarks visualized on the tympanic membrane.  Tympanometry: Right ear: Type A- Normal external ear canal volume with normal middle ear pressure and tympanic membrane compliance Left ear: Type A- Normal external ear canal volume with normal middle ear pressure and tympanic membrane compliance    Pure tone Audiometry: Right ear- Essentially normal hearing, except for a mild sensorineural hearing loss from 6000 Hz and 8000 Hz.   Left ear-  Essentially normal hearing, except for a mild sensorineural hearing loss at 4000 Hz and 8000 Hz.    The hearing test results were completed under headphones and re-checked with pediatric inserts and results are deemed to be of good reliability. Test technique:  conventional     Speech Audiometry: Right ear- Speech Reception Threshold (SRT) was obtained at 15 dBHL Left ear-Speech Reception Threshold (SRT) was obtained at 20 dBHL   Word Recognition Score Tested using NU-6 (MLV) Right ear: 96% was  obtained at a presentation level of 60 dBHL with contralateral masking which is deemed as  excellent Left ear: 96% was obtained at a presentation level of 60 dBHL with contralateral masking which is deemed as  excellent    Impression: Slight right sided hearing difference may explain her pressure sensation.   Recommendations: Follow up with ENT as scheduled for today. Return for a hearing evaluation if concerns with hearing changes arise or per MD recommendation.   Cashton Hosley MARIE LEROUX-MARTINEZ, AUD

## 2023-12-27 ENCOUNTER — Institutional Professional Consult (permissible substitution) (INDEPENDENT_AMBULATORY_CARE_PROVIDER_SITE_OTHER): Payer: Medicare Other

## 2024-01-30 ENCOUNTER — Other Ambulatory Visit: Payer: Self-pay | Admitting: Family Medicine

## 2024-01-30 NOTE — Telephone Encounter (Signed)
Copied from CRM [id number]. Topic: Clinical - Medication Refill >> Jan 30, 2024  4:07 PM Thomes Dinning wrote: Most Recent Primary Care Visit:  Provider: Sw[place], BETTY G  Department: [facility]  Visit Type: OFFICE VISIT  Date: 11/26/2023  Medication:  rosuvastatin (CRESTOR) 5 MG tablet  Has the patient contacted their pharmacy? Yes (Agent: If no, request that the patient contact the pharmacy for the refill. If patient does not wish to contact the pharmacy document the reason why and proceed with request.) (Agent: If yes, when and what did the pharmacy advise?)  Is this the correct pharmacy for this prescription? Yes If no, delete pharmacy and type the correct one.  This is the patient's preferred pharmacy:  [facility]) - [place], [place] - [address] [facility] [address] [place] [address] Phone: [phone] Fax: [phone]  [facility] - [place], [place] - [address] [place] [address] Phone: [phone] Fax: [phone]   Has the prescription been filled recently? No  Is the patient out of the medication? Yes  Has the patient been seen for an appointment in the last year OR does the patient have an upcoming appointment? Yes  Can we respond through MyChart? Yes  Agent: Please be advised that Rx refills may take up to 3 business days. We ask that you follow-up with your pharmacy.

## 2024-02-01 MED ORDER — ROSUVASTATIN CALCIUM 5 MG PO TABS: 5.0000 mg | ORAL_TABLET | Freq: Every day | ORAL | 1 refills | Status: DC

## 2024-02-06 ENCOUNTER — Telehealth (INDEPENDENT_AMBULATORY_CARE_PROVIDER_SITE_OTHER): Payer: Self-pay | Admitting: Otolaryngology

## 2024-02-06 NOTE — Telephone Encounter (Signed)
Left vm to confirm appt and address for 02/07/2024.

## 2024-02-07 ENCOUNTER — Ambulatory Visit (INDEPENDENT_AMBULATORY_CARE_PROVIDER_SITE_OTHER): Payer: Medicare Other | Admitting: Otolaryngology

## 2024-02-07 ENCOUNTER — Encounter (INDEPENDENT_AMBULATORY_CARE_PROVIDER_SITE_OTHER): Payer: Self-pay

## 2024-02-07 VITALS — BP 124/78 | HR 68

## 2024-02-07 DIAGNOSIS — H6991 Unspecified Eustachian tube disorder, right ear: Secondary | ICD-10-CM

## 2024-02-07 DIAGNOSIS — H903 Sensorineural hearing loss, bilateral: Secondary | ICD-10-CM | POA: Diagnosis not present

## 2024-02-07 DIAGNOSIS — H938X1 Other specified disorders of right ear: Secondary | ICD-10-CM

## 2024-02-07 MED ORDER — OFLOXACIN 0.3 % OT SOLN: 4.0000 [drp] | Freq: Two times a day (BID) | OTIC | 0 refills | Status: AC

## 2024-02-07 NOTE — Patient Instructions (Signed)
Use ofloxacin ear drops - 4 drops twice per day for 5 days

## 2024-02-07 NOTE — Progress Notes (Signed)
 Dear Dr. Casimiro Needle, Here is my assessment for our mutual patient, Whitney Beasley. Thank you for allowing me the opportunity to care for your patient. Please do not hesitate to contact me should you have any other questions. Sincerely, Dr. Jovita Kussmaul  Otolaryngology Clinic Note Referring provider: Dr. Casimiro Needle HPI:  Whitney Beasley is a 78 y.o. female kindly referred by Dr. Casimiro Needle for evaluation of right ear eustachian tube dysfunction.   Initial visit (11/2023): Patient reports: Reports primary complaint is right ear fullness, started on flight on 09/18/2023 at which time she had a URI. She watched it for a few weeks, tried to yawn and pop it but could not get ear to pop. Persisted, saw Dr. Swaziland in Dec 2024 who prescribed her abx and referred her here given chronicity. Current symptoms include fullness, ear discomfort, pressure. Hearing is fine, no concerns. No tinnitus. No popping/crackling. She has tried Mucinex, Augmentin, autoinflation.  She uses antihistamine PRN; have not tried any sprays. Patient denies: vertigo, drainage, tinnitus Patient also denies barotrauma, vestibular suppressant use, ototoxic medication use Prior ear surgery: no Don't normally get ear infections  No facial pressure/pain, no anterior rhinorrhea, hyposmia, no frequent sinus infections; some PND --------------------------------------------------------- 02/07/2024 Right ear fullness continues, but is intermittent. About the same - pred helped briefly but then it returned. Want to get it popped but can't. Hearing without issues, no tinnitus, drainage. Using flonase and astelin without any benefit. ----------------------------------------------------------  PMHx: Allergic Rhinits, Osteoporosis, Adjustment disorder  H&N Surgery: Tonsillectomy as a child Personal or FHx of bleeding dz or anesthesia difficulty: no  AP/AC: no  Tobacco: no. Occupation: prior Diplomatic Services operational officer. Lives in [place], [place]  Independent Review of  Additional Tests or Records:  11/26/2023 Dr. Swaziland reviewed and uploaded or available in chart: right ear fullness, started on flight on 09/18/2023. Worsened, can't get ear to pop. Some ear ache. Did have URI prior.  No drainage or other ear sx; Rx: Mucinex, Augmentin, autoinflation, ENT referred Hearing screen: passed AU [phone] Hz CBC and CMP 07/2023 reviewed independently: wnl  11/2023 Audiogram was independently reviewed and interpreted by me and it reveals  A/A tymps; agree that: "Pure tone Audiometry: Right ear- Essentially normal hearing, except for a mild sensorineural hearing loss from 6000 Hz and 8000 Hz.   Left ear-  Essentially normal hearing, except for a mild sensorineural hearing loss at 4000 Hz and 8000 Hz." Small ABG left ear but no significant asymmetry  SNHL= Sensorineural hearing loss   PMH/Meds/All/SocHx/FamHx/ROS:   Past Medical History:  Diagnosis Date   Allergy    Asthma    as child   Depression    GERD (gastroesophageal reflux disease)    Ulcerative colitis (HCC)    UTI (urinary tract infection)      Past Surgical History:  Procedure Laterality Date   bone spur left pinky finger under local  07/30/2019   COLONOSCOPY  04/01/2014, 2013   2015 normal exam,  in [place]   TONSILLECTOMY Bilateral 1950's    Family History  Problem Relation Age of Onset   Diabetes Mother        had MI late 23's   Colon polyps Mother    COPD Father        smoker, but stopped in teh 40   Colon polyps Father    Colon cancer Paternal Grandfather    Arthritis Sister    Diabetes Maternal Grandmother    Cancer Maternal Grandfather    Esophageal cancer Neg Hx    Rectal  cancer Neg Hx    Stomach cancer Neg Hx      Social Connections: Socially Integrated (11/25/2023)   Social Connection and Isolation Panel [NHANES]    Frequency of Communication with Friends and Family: Three times a week    Frequency of Social Gatherings with Friends and Family: Once a week    Attends  Religious Services: More than 4 times per year    Active Member of [employer] or Organizations: Yes    Attends Engineer, structural: More than 4 times per year    Marital Status: Married      Current Outpatient Medications:    azelastine (ASTELIN) 0.1 % nasal spray, Place 2 sprays into both nostrils 2 (two) times daily. Use in each nostril as directed, Disp: 30 mL, Rfl: 12   B Complex-C (SUPER B COMPLEX PO), Take 1 tablet by mouth daily., Disp: , Rfl:    calcium carbonate 200 MG capsule, Take 1,600 mg by mouth daily. , Disp: , Rfl:    fluticasone (FLONASE) 50 MCG/ACT nasal spray, Place 2 sprays into both nostrils 2 (two) times daily., Disp: 11 mL, Rfl: 6   Glucosamine-Chondroitin 750-600 MG TABS, Take 1 tablet by mouth daily., Disp: , Rfl:    Multiple Vitamin (MULTIVITAMIN) tablet, Take 1 tablet by mouth daily., Disp: , Rfl:    ofloxacin (FLOXIN) 0.3 % OTIC solution, Place 4 drops into the right ear 2 (two) times daily for 5 days., Disp: 5 mL, Rfl: 0   rosuvastatin (CRESTOR) 5 MG tablet, Take 1 tablet (5 mg total) by mouth daily., Disp: 90 tablet, Rfl: 1   tretinoin (RETIN-A) 0.025 % gel, Apply topically as needed., Disp: , Rfl:    venlafaxine XR (EFFEXOR-XR) 37.5 MG 24 hr capsule, TAKE 1 CAPSULE BY MOUTH 3 TIMES  WEEKLY, Disp: 43 capsule, Rfl: 2   VITAMIN D PO, Take 1,600 mg by mouth daily., Disp: , Rfl:    zinc gluconate 50 MG tablet, Take 50 mg by mouth daily., Disp: , Rfl:    Physical Exam:   BP 124/78 (BP Location: Right Arm, Patient Position: Sitting, Cuff Size: Normal)   Pulse 68   LMP  (LMP Unknown) Comment: 2005  SpO2 96%   Salient findings:  CN II-XII intact Bilateral EAC clear and TM intact with well pneumatized middle ear spaces; EACs are narrow but able to fit normal speculum in Weber 512: midline Rinne 512: AC > BC b/l Anterior rhinoscopy: Septum intact; bilateral inferior turbinates without significant hypertrophy; No purulence noted No lesions of oral  cavity/oropharynx No obviously palpable neck masses/lymphadenopathy/thyromegaly No respiratory distress or stridor  Seprately Identifiable Procedures:  Procedure: Bilateral ear microscopy with RIGHT myringotomy (CPT [phone]) Pre-procedure diagnosis:  Right eustachian tube dysfunction Post-procedure diagnosis: same Indication: Patient is a 78 y.o. female with pre-procedure diagnoses above. She has tried nasal sprays without any benefit. We discussed options including continued observation, myringotomy or myringotomy with Tympanostomy tube. Risks discussed including perforation and patient wished to just try myringotomy.  We had a long discussion about benefits and risks including pain, bleeding, TM perforation, otorrhea, injury to middle or external ear structures, cholesteatoma, hearing loss, among others. Consent was obtained prior to proceeding.  Findings: Right Ear: see above; successful anterior-inferior myringotomy Left ear: see above  Complications: None apparent  Procedure details: Patient was placed semi recumbent on the exam chair. The right ear was addressed first with binocular microscopy and any cerumen was cleaned from the ear canal. The tympanic membrane was visualized,  with findings as above. A focal spot over the anterior-inferior TM was anesthetized with topical phenol. A myringotomy incision wastthen made radially. There was no effusion was encountered. Ciprodex drops were applied. A cotton ball was placed at the meatus.  Patient tolerated the procedure well   Impression & Plans:  Whitney Beasley is a 78 y.o. female with seasonal allergies now with:  1. Sensorineural hearing loss, bilateral   2. Dysfunction of right eustachian tube   3. Ear fullness, right    Started after a flight with persistent symptoms. No effusion on exam so deferred TFL. Discussed options: 1. Continued Medical 2. Right tube (though tymps are A, her history and sx sound consistent with ETD) v/s Right  myringotomy Her symptoms have persisted despite medical management and thus she opted for myringotomy, which was done today after discussing risks  - Continue flonase and astelin BID - Ofloxacin ear drops 4 drops BID x5d - f/u 6 weeks  See below regarding exact medications prescribed this encounter including dosages and route: Meds ordered this encounter  Medications   ofloxacin (FLOXIN) 0.3 % OTIC solution    Sig: Place 4 drops into the right ear 2 (two) times daily for 5 days.    Dispense:  5 mL    Refill:  0      Thank you for allowing me the opportunity to care for your patient. Please do not hesitate to contact me should you have any other questions.  Sincerely, Jovita Kussmaul, MD Otolaryngologist (ENT), [facility] Phone: [phone] Fax: [phone]  02/09/2024, 8:13 PM   MDM:  Level 4 Complexity/Problems addressed: mod - continued chronic medical problems, stable Data complexity: low - Morbidity: mod   - Prescription Drug prescribed or managed: yes

## 2024-02-20 ENCOUNTER — Other Ambulatory Visit (INDEPENDENT_AMBULATORY_CARE_PROVIDER_SITE_OTHER): Payer: Self-pay | Admitting: Otolaryngology

## 2024-03-04 ENCOUNTER — Telehealth (INDEPENDENT_AMBULATORY_CARE_PROVIDER_SITE_OTHER): Payer: Self-pay | Admitting: Otolaryngology

## 2024-03-04 NOTE — Telephone Encounter (Signed)
 Reminder Call:  Date: 03/05/2024 Status: Sch  Time: 10:45 AM Confirmed time and location w/patient-[address] [place], [place] [address]

## 2024-03-05 ENCOUNTER — Ambulatory Visit (INDEPENDENT_AMBULATORY_CARE_PROVIDER_SITE_OTHER): Payer: Medicare Other | Admitting: Otolaryngology

## 2024-03-05 ENCOUNTER — Encounter (INDEPENDENT_AMBULATORY_CARE_PROVIDER_SITE_OTHER): Payer: Self-pay

## 2024-03-05 VITALS — BP 152/85 | HR 80 | Ht 65.0 in | Wt 162.0 lb

## 2024-03-05 DIAGNOSIS — H903 Sensorineural hearing loss, bilateral: Secondary | ICD-10-CM

## 2024-03-05 DIAGNOSIS — H6991 Unspecified Eustachian tube disorder, right ear: Secondary | ICD-10-CM | POA: Diagnosis not present

## 2024-03-05 DIAGNOSIS — H938X1 Other specified disorders of right ear: Secondary | ICD-10-CM

## 2024-03-05 NOTE — Progress Notes (Signed)
 Dear Dr. Bambi Lever, Here is my assessment for our mutual patient, Whitney Beasley. Thank you for allowing me the opportunity to care for your patient. Please do not hesitate to contact me should you have any other questions. Sincerely, Dr. Milon Aloe  Otolaryngology Clinic Note Referring provider: Dr. Bambi Lever HPI:  Whitney Beasley is a 78 y.o. female kindly referred by Dr. Bambi Lever for evaluation of right ear eustachian tube dysfunction.   Initial visit (11/2023): Patient reports: Reports primary complaint is right ear fullness, started on flight on 09/18/2023 at which time she had a URI. She watched it for a few weeks, tried to yawn and pop it but could not get ear to pop. Persisted, saw Dr. Swaziland in Dec 2024 who prescribed her abx and referred her here given chronicity. Current symptoms include fullness, ear discomfort, pressure. Hearing is fine, no concerns. No tinnitus. No popping/crackling. She has tried Mucinex, Augmentin, autoinflation.  She uses antihistamine PRN; have not tried any sprays. Patient denies: vertigo, drainage, tinnitus Patient also denies barotrauma, vestibular suppressant use, ototoxic medication use Prior ear surgery: no Don't normally get ear infections  No facial pressure/pain, no anterior rhinorrhea, hyposmia, no frequent sinus infections; some PND --------------------------------------------------------- 02/07/2024 Right ear fullness continues, but is intermittent. About the same - pred helped briefly but then it returned. Want to get it popped but can't. Hearing without issues, no tinnitus, drainage. Using flonase and astelin without any benefit. --------------------------------------------------------- 03/05/2024: Whitney Beasley returns for f/u after myringotomy. She reports maybe the procedure helped some (with fullness mostly), but her symptoms returned. She did not feel as if the myringotomy helped significantly.    PMHx: Allergic Rhinits, Osteoporosis, Adjustment  disorder  H&N Surgery: Tonsillectomy as a child Personal or FHx of bleeding dz or anesthesia difficulty: no  AP/AC: no  Tobacco: no. 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 Swaziland reviewed and uploaded or available in chart: right ear fullness, started on flight on 09/18/2023. Worsened, can't get ear to pop. Some ear ache. Did have URI prior.  No drainage or other ear sx; Rx: Mucinex, Augmentin, autoinflation, ENT referred Hearing screen: passed AU [phone] CBC and CMP 07/2023 reviewed independently: wnl  11/2023 Audiogram was independently reviewed and interpreted by me and it reveals  A/A tymps; agree that: "Pure tone Audiometry: Right ear- Essentially normal hearing, except for a mild sensorineural hearing loss from 6000 Hz and 8000 Hz.   Left ear-  Essentially normal hearing, except for a mild sensorineural hearing loss at 4000 Hz and 8000 Hz." Small ABG left ear but no significant asymmetry  SNHL= Sensorineural hearing loss   PMH/Meds/All/SocHx/FamHx/ROS:   Past Medical History:  Diagnosis Date   Allergy    Asthma    as child   Depression    GERD (gastroesophageal reflux disease)    Ulcerative colitis (HCC)    UTI (urinary tract infection)      Past Surgical History:  Procedure Laterality Date   bone spur left pinky finger under local  07/30/2019   COLONOSCOPY  04/01/2014, 2013   2015 normal exam,  in [place]    TONSILLECTOMY Bilateral 1950's    Family History  Problem Relation Age of Onset   Diabetes Mother        had MI late 46's   Colon polyps Mother    COPD Father        smoker, but stopped in teh 71   Colon polyps Father  Colon cancer Paternal Grandfather    Arthritis Sister    Diabetes Maternal Grandmother    Cancer Maternal Grandfather    Esophageal cancer Neg Hx    Rectal cancer Neg Hx    Stomach cancer Neg Hx      Social Connections: Socially Integrated  (11/25/2023)   Social Connection and Isolation Panel [NHANES]    Frequency of Communication with Friends and Family: Three times a week    Frequency of Social Gatherings with Friends and Family: Once a week    Attends Religious Services: More than 4 times per year    Active Member of Clubs or Organizations: Yes    Attends Engineer, structural: More than 4 times per year    Marital Status: Married      Current Outpatient Medications:    B Complex-C (SUPER B COMPLEX PO), Take 1 tablet by mouth daily., Disp: , Rfl:    calcium carbonate 200 MG capsule, Take 1,600 mg by mouth daily. , Disp: , Rfl:    Glucosamine-Chondroitin 750-600 MG TABS, Take 1 tablet by mouth daily., Disp: , Rfl:    Multiple Vitamin (MULTIVITAMIN) tablet, Take 1 tablet by mouth daily., Disp: , Rfl:    rosuvastatin (CRESTOR) 5 MG tablet, Take 1 tablet (5 mg total) by mouth daily., Disp: 90 tablet, Rfl: 1   tretinoin (RETIN-A) 0.025 % gel, Apply topically as needed., Disp: , Rfl:    venlafaxine XR (EFFEXOR-XR) 37.5 MG 24 hr capsule, TAKE 1 CAPSULE BY MOUTH 3 TIMES  WEEKLY, Disp: 43 capsule, Rfl: 2   VITAMIN D PO, Take 1,600 mg by mouth daily., Disp: , Rfl:    zinc gluconate 50 MG tablet, Take 50 mg by mouth daily., Disp: , Rfl:    azelastine (ASTELIN) 0.1 % nasal spray, Place 2 sprays into both nostrils 2 (two) times daily. Use in each nostril as directed (Patient not taking: Reported on 03/05/2024), Disp: 30 mL, Rfl: 12   fluticasone (FLONASE) 50 MCG/ACT nasal spray, Place 2 sprays into both nostrils 2 (two) times daily. (Patient not taking: Reported on 03/05/2024), Disp: 11 mL, Rfl: 6   Physical Exam:   BP (!) 152/85 (BP Location: Left Arm, Patient Position: Sitting, Cuff Size: Normal)   Pulse 80   Ht 5\' 5"  (1.651 m)   Wt 162 lb (73.5 kg)   LMP  (LMP Unknown) Comment: 2005  SpO2 93%   BMI 26.96 kg/m   Salient findings:  CN II-XII intact Given history and complaints, ear microscopy was indicated and performed  for evaluation with findings as below in physical exam section and in procedures; Bilateral EAC clear and TM intact with well pneumatized middle ear spaces; EACs somewhat narrow; TM has healed Weber 512: midline Rinne 512: AC > BC b/l No respiratory distress or stridor  Seprately Identifiable Procedures:  Procedure: Bilateral ear microscopy using microscope (CPT [id number]) Pre-procedure diagnosis: right ear fulless, prior myringotomy, evaluate tympanic membrane Post-procedure diagnosis: same Indication: see above; given patient's otologic complaints and history, for improved and comprehensive examination of external ear and tympanic membrane, bilateral otologic examination using microscope was performed  Procedure: Patient was placed semi-recumbent. Both ear canals were examined using the microscope with findings above. Patient tolerated the procedure well.    Impression & Plans:  Whitney Beasley is a 78 y.o. female with seasonal allergies now with:  1. Sensorineural hearing loss, bilateral   2. Dysfunction of right eustachian tube   3. Ear fullness, right    Started after  a flight with persistent symptoms. No effusion on exam so deferred TFL with prior type A tymps.. Symptoms persist despite medical management, and we did perform a myringotomy as well with perhaps some transient improvement. No significant nasal symptoms.   We did discuss that ear fullness can also be due to other symptoms such as TMJ, or perhaps this is ETD but it is slowly resolving and thus offered tymp tube. D/w pt re: further w/u including imaging but deferred  - Continue flonase and astelin BID - d/w pt re: f/u; she'd like to call us  as needed - I advised to do so if her symptoms persist over next few months  See below regarding exact medications prescribed this encounter including dosages and route: No orders of the defined types were placed in this encounter.     Thank you for allowing me the opportunity to care  for your patient. Please do not hesitate to contact me should you have any other questions.  Sincerely, Milon Aloe, MD Otolaryngologist (ENT), [facility] Phone: [phone] Fax: [phone]  03/30/2024, 12:46 PM   MDM:  Level 3 - 99213 Complexity/Problems addressed: mod Data complexity: low - Morbidity: low   - Prescription Drug prescribed or managed: no

## 2024-03-20 ENCOUNTER — Other Ambulatory Visit: Payer: Self-pay | Admitting: Family Medicine

## 2024-03-20 DIAGNOSIS — Z1231 Encounter for screening mammogram for malignant neoplasm of breast: Secondary | ICD-10-CM

## 2024-04-10 ENCOUNTER — Other Ambulatory Visit: Payer: Self-pay | Admitting: Family Medicine

## 2024-06-25 ENCOUNTER — Other Ambulatory Visit: Payer: Self-pay | Admitting: Family Medicine

## 2024-06-25 DIAGNOSIS — F4323 Adjustment disorder with mixed anxiety and depressed mood: Secondary | ICD-10-CM

## 2024-07-03 DIAGNOSIS — D2271 Melanocytic nevi of right lower limb, including hip: Secondary | ICD-10-CM | POA: Diagnosis not present

## 2024-07-03 DIAGNOSIS — L918 Other hypertrophic disorders of the skin: Secondary | ICD-10-CM | POA: Diagnosis not present

## 2024-07-03 DIAGNOSIS — D2262 Melanocytic nevi of left upper limb, including shoulder: Secondary | ICD-10-CM | POA: Diagnosis not present

## 2024-07-03 DIAGNOSIS — D2261 Melanocytic nevi of right upper limb, including shoulder: Secondary | ICD-10-CM | POA: Diagnosis not present

## 2024-07-03 DIAGNOSIS — L820 Inflamed seborrheic keratosis: Secondary | ICD-10-CM | POA: Diagnosis not present

## 2024-07-03 DIAGNOSIS — Z85820 Personal history of malignant melanoma of skin: Secondary | ICD-10-CM | POA: Diagnosis not present

## 2024-07-03 DIAGNOSIS — D485 Neoplasm of uncertain behavior of skin: Secondary | ICD-10-CM | POA: Diagnosis not present

## 2024-07-03 DIAGNOSIS — L821 Other seborrheic keratosis: Secondary | ICD-10-CM | POA: Diagnosis not present

## 2024-07-03 DIAGNOSIS — L570 Actinic keratosis: Secondary | ICD-10-CM | POA: Diagnosis not present

## 2024-07-03 DIAGNOSIS — D225 Melanocytic nevi of trunk: Secondary | ICD-10-CM | POA: Diagnosis not present

## 2024-07-03 DIAGNOSIS — D1801 Hemangioma of skin and subcutaneous tissue: Secondary | ICD-10-CM | POA: Diagnosis not present

## 2024-07-08 ENCOUNTER — Ambulatory Visit: Admitting: Family Medicine

## 2024-07-11 ENCOUNTER — Telehealth: Payer: Self-pay

## 2024-07-11 MED ORDER — ROSUVASTATIN CALCIUM 5 MG PO TABS: 5.0000 mg | ORAL_TABLET | ORAL | 0 refills | Status: DC

## 2024-07-11 NOTE — Progress Notes (Signed)
   07/11/2024  Patient ID: Whitney Beasley, female   DOB: Jun 03, 1946, 78 y.o.   MRN: [id number]  Pharmacy Quality Measure Review  This patient is appearing on a report for being at risk of failing the adherence measure for cholesterol (statin) medications this calendar year.   Medication: Rosuvastatin  5mg  Last fill date: 02/11/24 for 90 day supply  Spoke with patient. She is only taking statin three times per week. Sent in rx to reflect how patient is currently taking. Patient has follow up with PCP pending, recommend updated lipid panel at that time.  Jon VEAR Lindau, PharmD Clinical Pharmacist [phone]

## 2024-07-12 ENCOUNTER — Other Ambulatory Visit: Payer: Self-pay | Admitting: Family Medicine

## 2024-07-12 DIAGNOSIS — F4323 Adjustment disorder with mixed anxiety and depressed mood: Secondary | ICD-10-CM

## 2024-07-16 ENCOUNTER — Ambulatory Visit
Admission: RE | Admit: 2024-07-16 | Discharge: 2024-07-16 | Disposition: A | Discharge status: Home / Self Care | Source: Ambulatory Visit | Attending: Family Medicine

## 2024-07-16 DIAGNOSIS — M8588 Other specified disorders of bone density and structure, other site: Secondary | ICD-10-CM | POA: Diagnosis not present

## 2024-07-16 DIAGNOSIS — Z1231 Encounter for screening mammogram for malignant neoplasm of breast: Secondary | ICD-10-CM | POA: Diagnosis not present

## 2024-07-16 LAB — HM DEXA SCAN

## 2024-07-21 ENCOUNTER — Ambulatory Visit: Payer: Self-pay | Admitting: Family Medicine

## 2024-07-21 ENCOUNTER — Other Ambulatory Visit: Payer: Self-pay | Admitting: Family Medicine

## 2024-07-21 DIAGNOSIS — R928 Other abnormal and inconclusive findings on diagnostic imaging of breast: Secondary | ICD-10-CM

## 2024-07-22 ENCOUNTER — Encounter: Payer: Self-pay | Admitting: Family Medicine

## 2024-07-22 ENCOUNTER — Ambulatory Visit (INDEPENDENT_AMBULATORY_CARE_PROVIDER_SITE_OTHER): Admitting: Family Medicine

## 2024-07-22 ENCOUNTER — Ambulatory Visit
Admission: RE | Admit: 2024-07-22 | Discharge: 2024-07-22 | Disposition: A | Discharge status: Home / Self Care | Source: Ambulatory Visit | Attending: Family Medicine | Admitting: Family Medicine

## 2024-07-22 VITALS — BP 130/82 | HR 77 | Temp 98.4°F | Ht 65.0 in | Wt 165.5 lb

## 2024-07-22 DIAGNOSIS — N6001 Solitary cyst of right breast: Secondary | ICD-10-CM | POA: Diagnosis not present

## 2024-07-22 DIAGNOSIS — R928 Other abnormal and inconclusive findings on diagnostic imaging of breast: Secondary | ICD-10-CM

## 2024-07-22 DIAGNOSIS — H1033 Unspecified acute conjunctivitis, bilateral: Secondary | ICD-10-CM | POA: Diagnosis not present

## 2024-07-22 DIAGNOSIS — J069 Acute upper respiratory infection, unspecified: Secondary | ICD-10-CM

## 2024-07-22 MED ORDER — OFLOXACIN 0.3 % OP SOLN: 1.0000 [drp] | Freq: Four times a day (QID) | OPHTHALMIC | 0 refills | Status: DC

## 2024-07-22 MED ORDER — AZITHROMYCIN 250 MG PO TABS: ORAL_TABLET | ORAL | 0 refills | Status: DC

## 2024-07-22 NOTE — Progress Notes (Signed)
 Established Patient Office Visit  Subjective   Patient ID: Whitney Beasley, female    DOB: 05-Feb-1946  Age: 78 y.o. MRN: [id number]  Chief Complaint  Patient presents with   Cough    Productive with yellow sputum x2 weeks, tried OTC medication with no relief   Eye Problem    Patient complains of pus in the eyes  for 2 days, used Benadryl and eye drops with relief   Nasal Congestion    Patient complains of yellow nasal drainage x2 weeks    Home covid test was negative last week. Has been sick for about 2 weeks now, states that the cough is still productive of yellowish sputum. States that her chest feels a little heavy, feels like there is something there also has a mild sore throat. No fever or chills, no chest pain, no SOB. Also started having crusting in her eyes and pus for the past few days, states she felt a foreign body sensation in the eye on the left, states that the eye drops -- states she had them left over from her eye surgeon and started using them, states that she has had improvement in her eye symptoms, she also started benadryl and this has been helpful as well. No other associated symptoms. No sinus pain or pressure, is able to breathe through her nose.  Cough This is a new problem. The current episode started 1 to 4 weeks ago. The problem has been gradually improving. The problem occurs every few minutes. She has tried OTC cough suppressant for the symptoms. The treatment provided no relief.    Current Outpatient Medications  Medication Instructions   azithromycin  (ZITHROMAX  Z-PAK) 250 MG tablet 2 tablets on day 1 ,then 1 tablet daily for 4 days.   B Complex-C (SUPER B COMPLEX PO) 1 tablet, Daily   calcium  carbonate 1,600 mg, Daily   Glucosamine-Chondroitin 750-600 MG TABS 1 tablet, Daily   Multiple Vitamin (MULTIVITAMIN) tablet 1 tablet, Daily   ofloxacin  (OCUFLOX ) 0.3 % ophthalmic solution 1 drop, Both Eyes, 4 times daily   rosuvastatin  (CRESTOR ) 5 mg, Oral, 3  times weekly   tretinoin (RETIN-A) 0.025 % gel As needed   venlafaxine  XR (EFFEXOR -XR) 37.5 MG 24 hr capsule TAKE 1 CAPSULE BY MOUTH 3 TIMES  WEEKLY   VITAMIN D  PO 1,600 mg, Daily   zinc gluconate 50 mg, Daily    Patient Active Problem List   Diagnosis Date Noted   Allergic rhinitis 08/08/2022   Ulcerative colitis (HCC) 03/30/2017   Osteoporosis 03/30/2017   Adjustment disorder with mixed anxiety and depressed mood 03/30/2017      Review of Systems  Respiratory:  Positive for cough.   All other systems reviewed and are negative.     Objective:     BP 130/82   Pulse 77   Temp 98.4 F (36.9 C) (Oral)   Ht 5' 5 (1.651 m)   Wt 165 lb 8 oz (75.1 kg)   LMP  (LMP Unknown) Comment: 2005  SpO2 95%   BMI 27.54 kg/m    Physical Exam Vitals reviewed.  Constitutional:      Appearance: Normal appearance. She is normal weight.  HENT:     Right Ear: Tympanic membrane normal.     Left Ear: Tympanic membrane normal.     Nose: Nose normal. No congestion or rhinorrhea.     Mouth/Throat:     Mouth: Mucous membranes are moist.     Pharynx: No oropharyngeal exudate or posterior  oropharyngeal erythema.  Eyes:     Conjunctiva/sclera: Conjunctivae normal.  Cardiovascular:     Rate and Rhythm: Normal rate and regular rhythm.     Heart sounds: Normal heart sounds.  Pulmonary:     Effort: Pulmonary effort is normal.     Breath sounds: Normal breath sounds. No wheezing, rhonchi or rales.  Lymphadenopathy:     Cervical:     Right cervical: No superficial cervical adenopathy.    Left cervical: No superficial cervical adenopathy.  Neurological:     Mental Status: She is alert and oriented to person, place, and time.  Psychiatric:        Mood and Affect: Mood normal.        Behavior: Behavior normal.      No results found for any visits on 07/22/24.    The 10-year ASCVD risk score (Arnett DK, et al., 2019) is: 18.5%    Assessment & Plan:  Acute bacterial conjunctivitis of  both eyes -     Ofloxacin ; Place 1 drop into both eyes 4 (four) times daily.  Dispense: 5 mL; Refill: 0  Acute upper respiratory infection -     Azithromycin ; 2 tablets on day 1 ,then 1 tablet daily for 4 days.  Dispense: 6 each; Refill: 0  Patient's physical exam is rather benign, we discussed that there may not be a strong need for antibiotics at this time, I offered that we could do further investigation with a chest film, pt states she would like to try the antibiotics to help her get better. I advised she continue with antihistamines since this is working for her symptoms.   No follow-ups on file.    Heron CHRISTELLA Sharper, MD

## 2024-07-23 ENCOUNTER — Ambulatory Visit: Payer: Self-pay | Admitting: Family Medicine

## 2024-08-12 ENCOUNTER — Encounter: Admitting: Family Medicine

## 2024-08-20 ENCOUNTER — Encounter: Payer: Self-pay | Admitting: Family Medicine

## 2024-08-20 ENCOUNTER — Ambulatory Visit: Admitting: Family Medicine

## 2024-08-20 VITALS — BP 132/80 | HR 80 | Temp 98.2°F | Ht 64.25 in | Wt 163.9 lb

## 2024-08-20 DIAGNOSIS — Z1211 Encounter for screening for malignant neoplasm of colon: Secondary | ICD-10-CM

## 2024-08-20 DIAGNOSIS — Z0000 Encounter for general adult medical examination without abnormal findings: Secondary | ICD-10-CM

## 2024-08-20 DIAGNOSIS — M85851 Other specified disorders of bone density and structure, right thigh: Secondary | ICD-10-CM | POA: Diagnosis not present

## 2024-08-20 DIAGNOSIS — M85852 Other specified disorders of bone density and structure, left thigh: Secondary | ICD-10-CM | POA: Diagnosis not present

## 2024-08-20 DIAGNOSIS — E782 Mixed hyperlipidemia: Secondary | ICD-10-CM

## 2024-08-20 DIAGNOSIS — F4323 Adjustment disorder with mixed anxiety and depressed mood: Secondary | ICD-10-CM

## 2024-08-20 DIAGNOSIS — Z131 Encounter for screening for diabetes mellitus: Secondary | ICD-10-CM | POA: Diagnosis not present

## 2024-08-20 LAB — COMPREHENSIVE METABOLIC PANEL WITH GFR
ALT: 19 U/L (ref 0–35)
AST: 24 U/L (ref 0–37)
Albumin: 4.2 g/dL (ref 3.5–5.2)
Alkaline Phosphatase: 76 U/L (ref 39–117)
BUN: 18 mg/dL (ref 6–23)
CO2: 29 meq/L (ref 19–32)
Calcium: 9.3 mg/dL (ref 8.4–10.5)
Chloride: 105 meq/L (ref 96–112)
Creatinine, Ser: 0.72 mg/dL (ref 0.40–1.20)
GFR: 81.34 mL/min (ref >60.00)
Glucose, Bld: 95 mg/dL (ref 70–99)
Potassium: 4.4 meq/L (ref 3.5–5.1)
Sodium: 142 meq/L (ref 135–145)
Total Bilirubin: 0.8 mg/dL (ref 0.2–1.2)
Total Protein: 6.9 g/dL (ref 6.0–8.3)

## 2024-08-20 LAB — LIPID PANEL
Cholesterol: 189 mg/dL (ref 0–200)
HDL: 74.7 mg/dL (ref >39.00)
LDL Cholesterol: 92 mg/dL (ref 0–99)
NonHDL: 114.49
Total CHOL/HDL Ratio: 3
Triglycerides: 112 mg/dL (ref 0.0–149.0)
VLDL: 22.4 mg/dL (ref 0.0–40.0)

## 2024-08-20 LAB — TSH: TSH: 1.43 u[IU]/mL (ref 0.35–5.50)

## 2024-08-20 LAB — VITAMIN D 25 HYDROXY (VIT D DEFICIENCY, FRACTURES): VITD: 61.17 ng/mL (ref 30.00–100.00)

## 2024-08-20 LAB — HEMOGLOBIN A1C: Hgb A1c MFr Bld: 5.9 % (ref 4.6–6.5)

## 2024-08-20 MED ORDER — VENLAFAXINE HCL ER 37.5 MG PO CP24: ORAL_CAPSULE | ORAL | 1 refills | Status: DC

## 2024-08-20 NOTE — Progress Notes (Signed)
 Complete physical exam  Patient: Whitney Beasley   DOB: 21-Jan-1946   78 y.o. Female  MRN: [id number]  Subjective:    Chief Complaint  Patient presents with   Annual Exam    Whitney Beasley is a 78 y.o. female who presents today for a complete physical exam. She reports consuming a general diet, get sources of protein, eating fruits and veggies daily, eats dairy, stays away from highly processed foods, occasional bacon, occasional snacks . Home exercise routine includes walking 1-2 hrs per week. She generally feels well. She reports sleeping well. She does not have additional problems to discuss today.   Is taking MVI daily, calcium  supplementation and vitamin D .  Most recent fall risk assessment:    08/20/2024   10:19 AM  Fall Risk   Falls in the past year? 1  Number falls in past yr: 0  Injury with Fall? 1  Risk for fall due to : Impaired balance/gait  Follow up Falls evaluation completed     Most recent depression screenings:    08/20/2024   10:20 AM 07/22/2024    1:25 PM  PHQ 2/9 Scores  PHQ - 2 Score 0 0  PHQ- 9 Score 1 2    Vision:Within last year and Dental: No current dental problems and Receives regular dental care  Patient Active Problem List   Diagnosis Date Noted   Allergic rhinitis 08/08/2022   Ulcerative colitis (HCC) 03/30/2017   Osteopenia of both hips 03/30/2017   Adjustment disorder with mixed anxiety and depressed mood 03/30/2017      Patient Care Team: Ozell Heron HERO, MD as PCP - General (Family Medicine) Latisha Medford, MD as Consulting Physician (Obstetrics and Gynecology) Ivin Kocher, MD as Referring Physician (Dermatology)   Outpatient Medications Prior to Visit  Medication Sig   B Complex-C (SUPER B COMPLEX PO) Take 1 tablet by mouth daily.   calcium  carbonate 200 MG capsule Take 1,600 mg by mouth daily.    Glucosamine-Chondroitin 750-600 MG TABS Take 1 tablet by mouth daily.   Multiple Vitamin (MULTIVITAMIN) tablet Take 1 tablet  by mouth daily.   rosuvastatin  (CRESTOR ) 5 MG tablet Take 1 tablet (5 mg total) by mouth 3 (three) times a week.   tretinoin (RETIN-A) 0.025 % gel Apply topically as needed.   VITAMIN D  PO Take 1,600 mg by mouth daily.   zinc gluconate 50 MG tablet Take 50 mg by mouth daily.   [DISCONTINUED] venlafaxine  XR (EFFEXOR -XR) 37.5 MG 24 hr capsule TAKE 1 CAPSULE BY MOUTH 3 TIMES  WEEKLY   [DISCONTINUED] azithromycin  (ZITHROMAX  Z-PAK) 250 MG tablet 2 tablets on day 1 ,then 1 tablet daily for 4 days.   [DISCONTINUED] ofloxacin  (OCUFLOX ) 0.3 % ophthalmic solution Place 1 drop into both eyes 4 (four) times daily.   No facility-administered medications prior to visit.    Review of Systems  HENT:  Negative for hearing loss.   Eyes:  Negative for blurred vision.  Respiratory:  Negative for shortness of breath.   Cardiovascular:  Negative for chest pain.  Gastrointestinal: Negative.   Genitourinary: Negative.   Musculoskeletal:  Negative for back pain.  Neurological:  Negative for headaches.  Psychiatric/Behavioral:  Negative for depression.        Objective:     BP 132/80   Pulse 80   Temp 98.2 F (36.8 C) (Oral)   Ht 5' 4.25 (1.632 m)   Wt 163 lb 14.4 oz (74.3 kg)   LMP  (LMP Unknown) Comment:  2005  SpO2 97%   BMI 27.91 kg/m    Physical Exam Vitals reviewed.  Constitutional:      Appearance: Normal appearance. She is well-groomed and normal weight.  HENT:     Right Ear: Tympanic membrane and ear canal normal.     Left Ear: Tympanic membrane and ear canal normal.     Mouth/Throat:     Mouth: Mucous membranes are moist.     Pharynx: No posterior oropharyngeal erythema.  Eyes:     Conjunctiva/sclera: Conjunctivae normal.  Neck:     Thyroid : No thyromegaly.  Cardiovascular:     Rate and Rhythm: Normal rate and regular rhythm.     Pulses: Normal pulses.     Heart sounds: S1 normal and S2 normal.  Pulmonary:     Effort: Pulmonary effort is normal.     Breath sounds: Normal  breath sounds and air entry.  Abdominal:     General: Abdomen is flat. Bowel sounds are normal.     Palpations: Abdomen is soft.  Musculoskeletal:     Right lower leg: No edema.     Left lower leg: No edema.  Lymphadenopathy:     Cervical: No cervical adenopathy.  Neurological:     Mental Status: She is alert and oriented to person, place, and time. Mental status is at baseline.     Gait: Gait is intact.  Psychiatric:        Mood and Affect: Mood and affect normal.        Speech: Speech normal.        Behavior: Behavior normal.        Judgment: Judgment normal.      No results found for any visits on 08/20/24.     Assessment & Plan:    Routine Health Maintenance and Physical Exam  Immunization History  Administered Date(s) Administered   Fluad Quad(high Dose 65+) 10/05/2021   INFLUENZA, HIGH DOSE SEASONAL PF 10/08/2014, 10/02/2016, 10/08/2017, 09/19/2018, 08/19/2023   Influenza-Unspecified 09/17/2017, 08/19/2019   Moderna Covid-19 Fall Seasonal Vaccine 76yrs & older 08/19/2023   Moderna SARS-COV2 Booster Vaccination 09/24/2022   Moderna Sars-Covid-2 Vaccination 04/21/2021   PFIZER Comirnaty(Gray Top)Covid-19 Tri-Sucrose Vaccine 01/19/2020, 02/17/2020   Pneumococcal Conjugate-13 05/07/2014   Pneumococcal Polysaccharide-23 03/30/2017   Pneumococcal-Unspecified 01/18/2017   Respiratory Syncytial Virus Vaccine,Recomb Aduvanted(Arexvy) 09/21/2022   Td 12/23/2007   Tdap 06/30/2021   Zoster Recombinant(Shingrix) 05/27/2018, 07/30/2018   Zoster, Live 12/02/2009, 01/18/2017    Health Maintenance  Topic Date Due   Medicare Annual Wellness (AWV)  04/23/2024   Colonoscopy  08/04/2024   COVID-19 Vaccine (6 - 2024-25 season) 08/18/2024   INFLUENZA VACCINE  03/17/2025 (Originally 07/18/2024)   MAMMOGRAM  07/16/2025   DTaP/Tdap/Td (3 - Td or Tdap) 07/01/2031   Pneumococcal Vaccine: 50+ Years  Completed   DEXA SCAN  Completed   Hepatitis C Screening  Completed   Zoster Vaccines-  Shingrix  Completed   HPV VACCINES  Aged Out   Meningococcal B Vaccine  Aged Out    Discussed health benefits of physical activity, and encouraged her to engage in regular exercise appropriate for her age and condition.  Mixed hyperlipidemia -     Comprehensive metabolic panel with GFR; Future -     Lipid panel; Future  Adjustment disorder with mixed anxiety and depressed mood -     Venlafaxine  HCl ER; TAKE 1 CAPSULE BY MOUTH 3  TIMES WEEKLY  Dispense: 36 capsule; Refill: 1 -     TSH; Future  Diabetes mellitus screening -     Hemoglobin A1c; Future  Routine general medical examination at a health care facility  Osteopenia of both hips -     VITAMIN D  25 Hydroxy (Vit-D Deficiency, Fractures); Future  Colon cancer screening -     Ambulatory referral to Gastroenterology  Normal physical exam findings. I counseled the patient on the recommended amount of exercise per CDC recommendation. I reviewed preventative screening, immunizations, and medical history and updated in the chart, and appropriate labs and vaccinations were ordered. Handouts given on healthy eating and exercise.    Return in 1 year (on 08/20/2025).     Heron CHRISTELLA Sharper, MD

## 2024-08-20 NOTE — Patient Instructions (Signed)

## 2024-08-27 ENCOUNTER — Ambulatory Visit: Payer: Self-pay | Admitting: Family Medicine

## 2024-08-28 ENCOUNTER — Other Ambulatory Visit: Payer: Self-pay | Admitting: Family Medicine

## 2024-09-02 ENCOUNTER — Encounter: Payer: Self-pay | Admitting: Obstetrics and Gynecology

## 2024-09-16 DIAGNOSIS — L988 Other specified disorders of the skin and subcutaneous tissue: Secondary | ICD-10-CM | POA: Diagnosis not present

## 2024-09-16 DIAGNOSIS — D485 Neoplasm of uncertain behavior of skin: Secondary | ICD-10-CM | POA: Diagnosis not present

## 2024-10-27 ENCOUNTER — Ambulatory Visit

## 2024-11-20 ENCOUNTER — Encounter: Payer: Self-pay | Admitting: Gastroenterology

## 2024-12-23 ENCOUNTER — Other Ambulatory Visit: Payer: Self-pay | Admitting: Family Medicine

## 2024-12-23 DIAGNOSIS — F4323 Adjustment disorder with mixed anxiety and depressed mood: Secondary | ICD-10-CM

## 2024-12-25 ENCOUNTER — Ambulatory Visit

## 2024-12-25 VITALS — Ht 65.0 in | Wt 165.0 lb

## 2024-12-25 DIAGNOSIS — K5180 Other ulcerative colitis without complications: Secondary | ICD-10-CM

## 2024-12-25 MED ORDER — NA SULFATE-K SULFATE-MG SULF 17.5-3.13-1.6 GM/177ML PO SOLN: 1.0000 | Freq: Once | ORAL | 0 refills | Status: AC

## 2024-12-25 NOTE — Progress Notes (Signed)

## 2024-12-26 ENCOUNTER — Telehealth: Payer: Self-pay

## 2024-12-26 NOTE — Telephone Encounter (Signed)
 RN called patient to confirm colonoscopy procedure date of  Thursday, 01/22/2025 with an arrival time of 10:00 AM with Dr. Stacia. Patient verbalizes understanding.  New prep instructions mailed and sent via Mychart with correct date and time.

## 2025-01-09 ENCOUNTER — Encounter: Admitting: Gastroenterology

## 2025-01-12 ENCOUNTER — Encounter: Payer: Self-pay | Admitting: Gastroenterology

## 2025-01-22 ENCOUNTER — Ambulatory Visit: Admitting: Gastroenterology

## 2025-01-22 ENCOUNTER — Encounter: Payer: Self-pay | Admitting: Gastroenterology

## 2025-01-22 VITALS — BP 146/89 | HR 72 | Temp 97.7°F | Resp 15 | Ht 65.0 in | Wt 165.0 lb

## 2025-01-22 DIAGNOSIS — K5520 Angiodysplasia of colon without hemorrhage: Secondary | ICD-10-CM | POA: Diagnosis not present

## 2025-01-22 DIAGNOSIS — K5730 Diverticulosis of large intestine without perforation or abscess without bleeding: Secondary | ICD-10-CM

## 2025-01-22 DIAGNOSIS — Z1211 Encounter for screening for malignant neoplasm of colon: Secondary | ICD-10-CM

## 2025-01-22 DIAGNOSIS — K5180 Other ulcerative colitis without complications: Secondary | ICD-10-CM

## 2025-01-22 DIAGNOSIS — K648 Other hemorrhoids: Secondary | ICD-10-CM

## 2025-01-22 DIAGNOSIS — K644 Residual hemorrhoidal skin tags: Secondary | ICD-10-CM

## 2025-01-22 MED ORDER — SODIUM CHLORIDE 0.9 % IV SOLN: 500.0000 mL | INTRAVENOUS | Status: DC

## 2025-01-22 NOTE — Progress Notes (Signed)
 [facility] History and Physical   Primary Care Physician:  Ozell Heron HERO, MD   Reason for Procedure:   Colon cancer screening, reported history of ulcerative colitis  Plan:    Colitis  The patient was provided an opportunity to ask questions and all were answered. The patient agreed with the plan    HPI: Whitney Beasley is a 79 y.o. female undergoing colonoscopy.  She has a reported history of ulcerative colitis.  No records are available for review.  She believes she was diagnosed around 2004 and took a medication for about 2 years before stopping.  She has not been on any medications for UC in about 20 years.   She underwent a colonoscopy by Dr. Aneita in 2020 with a normal appearing colon and normal colon biopsies.  Two small erosions were seen in the terminal ileum and biopsies showed chronic ileitis.  An erosion was seen at the ileocecal valve and biopsies of this were normal.  She has no chronic GI symptoms.    Past Medical History:  Diagnosis Date   Allergy    Asthma    as child   Depression    GERD (gastroesophageal reflux disease)    has not had to take any medicine in 10 years per pt   Ulcerative colitis (HCC)    UTI (urinary tract infection)     Past Surgical History:  Procedure Laterality Date   bone spur left pinky finger under local  07/30/2019   COLONOSCOPY  04/01/2014, 2013   2015 normal exam,  in [place]    TONSILLECTOMY Bilateral 1950's    Prior to Admission medications  Medication Sig Start Date End Date Taking? Authorizing Provider  B Complex-C (SUPER B COMPLEX PO) Take 1 tablet by mouth daily.   Yes [provider]  calcium  carbonate 200 MG capsule Take 1,600 mg by mouth daily.    Yes [provider]  Multiple Vitamin (MULTIVITAMIN) tablet Take 1 tablet by mouth daily.   Yes [provider]  rosuvastatin  (CRESTOR ) 5 MG tablet TAKE 1 TABLET BY MOUTH 3 TIMES  WEEKLY 08/28/24  Yes Ozell Heron HERO, MD   tazarotene (AVAGE) 0.1 % cream Apply topically. Nightly 12/16/24  Yes [provider]  venlafaxine  XR (EFFEXOR -XR) 37.5 MG 24 hr capsule TAKE 1 CAPSULE BY MOUTH 3 TIMES  WEEKLY 12/24/24  Yes Ozell Heron HERO, MD  VITAMIN D  PO Take 1,600 mg by mouth daily.   Yes [provider]  zinc gluconate 50 MG tablet Take 50 mg by mouth daily.   Yes [provider]    Current Outpatient Medications  Medication Sig Dispense Refill   B Complex-C (SUPER B COMPLEX PO) Take 1 tablet by mouth daily.     calcium  carbonate 200 MG capsule Take 1,600 mg by mouth daily.      Multiple Vitamin (MULTIVITAMIN) tablet Take 1 tablet by mouth daily.     rosuvastatin  (CRESTOR ) 5 MG tablet TAKE 1 TABLET BY MOUTH 3 TIMES  WEEKLY 39 tablet 2   tazarotene (AVAGE) 0.1 % cream Apply topically. Nightly     venlafaxine  XR (EFFEXOR -XR) 37.5 MG 24 hr capsule TAKE 1 CAPSULE BY MOUTH 3 TIMES  WEEKLY 36 capsule 2   VITAMIN D  PO Take 1,600 mg by mouth daily.     zinc gluconate 50 MG tablet Take 50 mg by mouth daily.     Current Facility-Administered Medications  Medication Dose Route Frequency Provider Last Rate Last Admin   0.9 %  sodium chloride  infusion  500 mL Intravenous Continuous Stacia Glendia BRAVO, MD        Allergies as of 01/22/2025 - Review Complete 01/22/2025  Allergen Reaction Noted   Minocycline Other (See Comments) 12/31/2014    Family History  Problem Relation Age of Onset   Diabetes Mother        had MI late 72's   Colon polyps Mother    COPD Father        smoker, but stopped in teh 57   Colon polyps Father    Colon cancer Paternal Grandfather    Arthritis Sister    Diabetes Maternal Grandmother    Cancer Maternal Grandfather    Esophageal cancer Neg Hx    Rectal cancer Neg Hx    Stomach cancer Neg Hx     Social History   Socioeconomic History   Marital status: Married    Spouse name: Not on file   Number of children: 1   Years of education: Not on file   Highest  education level: Bachelor's degree (e.g., BA, AB, BS)  Occupational History   Not on file  Tobacco Use   Smoking status: Never   Smokeless tobacco: Never  Vaping Use   Vaping status: Never Used  Substance and Sexual Activity   Alcohol use: Yes    Alcohol/week: 14.0 standard drinks of alcohol    Types: 14 Glasses of wine per week    Comment: 2 glasses of wine per day   Drug use: No   Sexual activity: Yes    Partners: Male  Other Topics Concern   Not on file  Social History Narrative   Lives with husband in 2 story house   Has one son and grandson and step-granddaughter   Active at gym: Development Worker, [employer] and low-impact exercise classes about 2X/week.   Enjoys reading and singing in choral groups   Social Drivers of Health   Tobacco Use: Low Risk (01/22/2025)   Patient History    Smoking Tobacco Use: Never    Smokeless Tobacco Use: Never    Passive Exposure: Not on file  Financial Resource Strain: Low Risk (07/21/2024)   Overall Financial Resource Strain (CARDIA)    Difficulty of Paying Living Expenses: Not hard at all  Food Insecurity: No Food Insecurity (07/21/2024)   Epic    Worried About Radiation Protection Practitioner of Food in the Last Year: Never true    Ran Out of Food in the Last Year: Never true  Transportation Needs: No Transportation Needs (07/21/2024)   Epic    Lack of Transportation (Medical): No    Lack of Transportation (Non-Medical): No  Physical Activity: Insufficiently Active (07/21/2024)   Exercise Vital Sign    Days of Exercise per Week: 4 days    Minutes of Exercise per Session: 20 min  Stress: Stress Concern Present (07/21/2024)   [employer] of Occupational Health - Occupational Stress Questionnaire    Feeling of Stress: To some extent  Social Connections: Socially Integrated (07/21/2024)   Social Connection and Isolation Panel    Frequency of Communication with Friends and Family: More than three times a week    Frequency of Social Gatherings with Friends and Family: More  than three times a week    Attends Religious Services: More than 4 times per year    Active Member of [employer] or Organizations: Yes    Attends Engineer, Structural: More than 4 times per year    Marital Status: Married  Catering Manager  Violence: Not At Risk (08/20/2024)   Epic    Fear of Current or Ex-Partner: No    Emotionally Abused: No    Physically Abused: No    Sexually Abused: No  Depression (PHQ2-9): Low Risk (08/20/2024)   Depression (PHQ2-9)    PHQ-2 Score: 1  Alcohol Screen: Low Risk (07/21/2024)   Alcohol Screen    Last Alcohol Screening Score (AUDIT): 5  Housing: Unknown (07/21/2024)   Epic    Unable to Pay for Housing in the Last Year: No    Number of Times Moved in the Last Year: Not on file    Homeless in the Last Year: No  Utilities: Not At Risk (08/20/2024)   Epic    Threatened with loss of utilities: No  Health Literacy: Adequate Health Literacy (08/20/2024)   B1300 Health Literacy    Frequency of need for help with medical instructions: Never    Review of Systems:  All other review of systems negative except as mentioned in the HPI.  Physical Exam: Vital signs BP (!) 143/80   Pulse 75   Temp 97.7 F (36.5 C) (Temporal)   Ht 5' 5 (1.651 m)   Wt 165 lb (74.8 kg)   LMP  (LMP Unknown) Comment: 2005  SpO2 95%   BMI 27.46 kg/m   General:   Alert,  Well-developed, well-nourished, pleasant and cooperative in NAD Airway:  Mallampati 2 Lungs:  Clear throughout to auscultation.   Heart:  Regular rate and rhythm; no murmurs, clicks, rubs,  or gallops. Abdomen:  Soft, nontender and nondistended. Normal bowel sounds.   Neuro/Psych:  Normal mood and affect. A and O x 3   Tyshauna Finkbiner E. Stacia, MD [facility]

## 2025-01-22 NOTE — Op Note (Signed)
 [facility] Patient Name: Whitney Beasley Procedure Date: 01/22/2025 11:30 AM MRN: [id number] Endoscopist: Glendia E. Stacia , MD, [id number] Age: 79 Referring MD:  Date of Birth: [birth year] Gender: Female Account #: [id number] Procedure:                Colonoscopy Indications:              High risk colon cancer surveillance: Ulcerative                            colitis, , Last colonoscopy: August 2020; patient                            reports being diagnosed with UC around 2004, was on                            medications for 1-2 years and then stopped. No                            medications or symptoms for 20 years. Normal                            colonoscopy with normal biopsies in 2020. Medicines:                Monitored Anesthesia Care Procedure:                Pre-Anesthesia Assessment:                           - Prior to the procedure, a History and Physical                            was performed, and patient medications and                            allergies were reviewed. The patient's tolerance of                            previous anesthesia was also reviewed. The risks                            and benefits of the procedure and the sedation                            options and risks were discussed with the patient.                            All questions were answered, and informed consent                            was obtained. Prior Anticoagulants: The patient has                            taken no anticoagulant or antiplatelet agents. ASA  Grade Assessment: II - A patient with mild systemic                            disease. After reviewing the risks and benefits,                            the patient was deemed in satisfactory condition to                            undergo the procedure.                           After obtaining informed consent, the colonoscope                            was passed under  direct vision. Throughout the                            procedure, the patient's blood pressure, pulse, and                            oxygen saturations were monitored continuously. The                            CF [id number] #[id number] was introduced through the anus                            and advanced to the the terminal ileum, with                            identification of the appendiceal orifice and IC                            valve. The colonoscopy was performed without                            difficulty. The patient tolerated the procedure                            well. The quality of the bowel preparation was                            excellent. The terminal ileum, ileocecal valve,                            appendiceal orifice, and rectum were photographed.                            The bowel preparation used was SUPREP via split                            dose instruction. Scope In: 11:49:33 AM Scope Out: 11:59:38 AM Scope Withdrawal Time: 0 hours 6 minutes 30 seconds  Total Procedure Duration: 0 hours  10 minutes 5 seconds  Findings:                 External hemorrhoids/skin tags were found on                            perianal exam.                           The digital rectal exam was normal. Pertinent                            negatives include normal sphincter tone and no                            palpable rectal lesions.                           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                           A few small-mouthed diverticula were found in the                            sigmoid colon and descending colon.                           The exam was otherwise normal throughout the                            examined colon.                           The terminal ileum appeared normal.                           Non-bleeding internal hemorrhoids were found during                            retroflexion.                           No  additional abnormalities were found on                            retroflexion. Complications:            No immediate complications. Estimated Blood Loss:     Estimated blood loss: none. Impression:               - Hemorrhoids found on perianal exam.                           - A single colonic angioectasia.                           - Mild diverticulosis in the sigmoid colon and in  the descending colon.                           - The examined portion of the ileum was normal.                           - Non-bleeding internal hemorrhoids.                           - No specimens collected.                           - No evidence of ulcerative colitis. Given absence                            of any colitis for >20 years, normal biopsies in                            2020 and short duration of symptoms in total (1-2                            years), recommend against any further colon cancer                            screening/dysplasia surveillance. Recommendation:           - Patient has a contact number available for                            emergencies. The signs and symptoms of potential                            delayed complications were discussed with the                            patient. Return to normal activities tomorrow.                            Written discharge instructions were provided to the                            patient.                           - Resume previous diet.                           - Continue present medications. Markeise Mathews E. Stacia, MD 01/22/2025 12:10:05 PM This report has been signed electronically.

## 2025-01-22 NOTE — Progress Notes (Signed)
 Vss nad trans to pacu

## 2025-01-22 NOTE — Patient Instructions (Addendum)
-   mild diverticulosis and internal hemorrhoids - Resume previous diet. - Continue present medications.   YOU HAD AN ENDOSCOPIC PROCEDURE TODAY AT THE [facility]:   Refer to the procedure report that was given to you for any specific questions about what was found during the examination.  If the procedure report does not answer your questions, please call your gastroenterologist to clarify.  If you requested that your care partner not be given the details of your procedure findings, then the procedure report has been included in a sealed envelope for you to review at your convenience later.  YOU SHOULD EXPECT: Some feelings of bloating in the abdomen. Passage of more gas than usual.  Walking can help get rid of the air that was put into your GI tract during the procedure and reduce the bloating. If you had a lower endoscopy (such as a colonoscopy or flexible sigmoidoscopy) you may notice spotting of blood in your stool or on the toilet paper. If you underwent a bowel prep for your procedure, you may not have a normal bowel movement for a few days.  Please Note:  You might notice some irritation and congestion in your nose or some drainage.  This is from the oxygen used during your procedure.  There is no need for concern and it should clear up in a day or so.  SYMPTOMS TO REPORT IMMEDIATELY:  Following lower endoscopy (colonoscopy or flexible sigmoidoscopy):  Excessive amounts of blood in the stool  Significant tenderness or worsening of abdominal pains  Swelling of the abdomen that is new, acute  Fever of 100F or higher  For urgent or emergent issues, a gastroenterologist can be reached at any hour by calling [phone]. Do not use MyChart messaging for urgent concerns.    DIET:  We do recommend a small meal at first, but then you may proceed to your regular diet.  Drink plenty of fluids but you should avoid alcoholic beverages for 24 hours.  ACTIVITY:  You should plan to  take it easy for the rest of today and you should NOT DRIVE or use heavy machinery until tomorrow (because of the sedation medicines used during the test).    FOLLOW UP: Our staff will call the number listed on your records the next business day following your procedure.  We will call around 7:15- 8:00 am to check on you and address any questions or concerns that you may have regarding the information given to you following your procedure. If we do not reach you, we will leave a message.     If any biopsies were taken you will be contacted by phone or by letter within the next 1-3 weeks.  Please call us  at [phone] if you have not heard about the biopsies in 3 weeks.    SIGNATURES/CONFIDENTIALITY: You and/or your care partner have signed paperwork which will be entered into your electronic medical record.  These signatures attest to the fact that that the information above on your After Visit Summary has been reviewed and is understood.  Full responsibility of the confidentiality of this discharge information lies with you and/or your care-partner.

## 2025-01-22 NOTE — Progress Notes (Signed)
 Pt's states no medical or surgical changes since previsit or office visit.

## 2025-01-23 ENCOUNTER — Telehealth: Payer: Self-pay

## 2025-01-23 NOTE — Telephone Encounter (Signed)
 LMOM
